# Patient Record
Sex: Female | Born: 1950 | Hispanic: No | Marital: Married | Smoking: Former smoker
Health system: Southern US, Community
[De-identification: ages and names within clinical notes are randomized; demographics above are authoritative.]

## PROBLEM LIST (undated history)

## (undated) DIAGNOSIS — K219 Gastro-esophageal reflux disease without esophagitis: Secondary | ICD-10-CM

## (undated) DIAGNOSIS — D649 Anemia, unspecified: Secondary | ICD-10-CM

## (undated) DIAGNOSIS — E039 Hypothyroidism, unspecified: Secondary | ICD-10-CM

## (undated) DIAGNOSIS — C801 Malignant (primary) neoplasm, unspecified: Secondary | ICD-10-CM

## (undated) DIAGNOSIS — F32A Depression, unspecified: Secondary | ICD-10-CM

## (undated) DIAGNOSIS — E7800 Pure hypercholesterolemia, unspecified: Secondary | ICD-10-CM

## (undated) DIAGNOSIS — C44301 Unspecified malignant neoplasm of skin of nose: Secondary | ICD-10-CM

## (undated) HISTORY — DX: Pure hypercholesterolemia, unspecified: E78.00

## (undated) HISTORY — PX: ABDOMINAL HYSTERECTOMY: SHX81

## (undated) HISTORY — PX: CHOLECYSTECTOMY: SHX55

## (undated) HISTORY — DX: Unspecified malignant neoplasm of skin of nose: C44.301

## (undated) HISTORY — DX: Anemia, unspecified: D64.9

## (undated) HISTORY — PX: COLONOSCOPY: SHX174

---

## 2001-07-31 ENCOUNTER — Other Ambulatory Visit: Admission: RE | Admit: 2001-07-31 | Discharge: 2001-07-31 | Payer: Self-pay | Admitting: Unknown Physician Specialty

## 2008-03-16 ENCOUNTER — Ambulatory Visit: Payer: Self-pay | Admitting: Cardiology

## 2015-05-19 DIAGNOSIS — H00016 Hordeolum externum left eye, unspecified eyelid: Secondary | ICD-10-CM | POA: Diagnosis not present

## 2015-05-19 DIAGNOSIS — H00013 Hordeolum externum right eye, unspecified eyelid: Secondary | ICD-10-CM | POA: Diagnosis not present

## 2015-07-19 DIAGNOSIS — I10 Essential (primary) hypertension: Secondary | ICD-10-CM | POA: Diagnosis not present

## 2015-07-19 DIAGNOSIS — R1013 Epigastric pain: Secondary | ICD-10-CM | POA: Diagnosis not present

## 2015-07-20 DIAGNOSIS — Z90710 Acquired absence of both cervix and uterus: Secondary | ICD-10-CM | POA: Diagnosis not present

## 2015-07-20 DIAGNOSIS — R1013 Epigastric pain: Secondary | ICD-10-CM | POA: Diagnosis not present

## 2015-07-20 DIAGNOSIS — Z9049 Acquired absence of other specified parts of digestive tract: Secondary | ICD-10-CM | POA: Diagnosis not present

## 2015-07-20 DIAGNOSIS — N838 Other noninflammatory disorders of ovary, fallopian tube and broad ligament: Secondary | ICD-10-CM | POA: Diagnosis not present

## 2015-11-15 DIAGNOSIS — Z23 Encounter for immunization: Secondary | ICD-10-CM | POA: Diagnosis not present

## 2015-12-03 DIAGNOSIS — B029 Zoster without complications: Secondary | ICD-10-CM | POA: Diagnosis not present

## 2015-12-23 DIAGNOSIS — Z299 Encounter for prophylactic measures, unspecified: Secondary | ICD-10-CM | POA: Diagnosis not present

## 2015-12-23 DIAGNOSIS — E7800 Pure hypercholesterolemia, unspecified: Secondary | ICD-10-CM | POA: Diagnosis not present

## 2015-12-23 DIAGNOSIS — Z6832 Body mass index (BMI) 32.0-32.9, adult: Secondary | ICD-10-CM | POA: Diagnosis not present

## 2015-12-23 DIAGNOSIS — Z0000 Encounter for general adult medical examination without abnormal findings: Secondary | ICD-10-CM | POA: Diagnosis not present

## 2015-12-23 DIAGNOSIS — Z79899 Other long term (current) drug therapy: Secondary | ICD-10-CM | POA: Diagnosis not present

## 2015-12-23 DIAGNOSIS — Z7189 Other specified counseling: Secondary | ICD-10-CM | POA: Diagnosis not present

## 2015-12-23 DIAGNOSIS — E039 Hypothyroidism, unspecified: Secondary | ICD-10-CM | POA: Diagnosis not present

## 2015-12-23 DIAGNOSIS — Z1211 Encounter for screening for malignant neoplasm of colon: Secondary | ICD-10-CM | POA: Diagnosis not present

## 2016-01-27 DIAGNOSIS — Z1231 Encounter for screening mammogram for malignant neoplasm of breast: Secondary | ICD-10-CM | POA: Diagnosis not present

## 2016-05-01 DIAGNOSIS — N3946 Mixed incontinence: Secondary | ICD-10-CM | POA: Diagnosis not present

## 2016-05-01 DIAGNOSIS — N393 Stress incontinence (female) (male): Secondary | ICD-10-CM | POA: Diagnosis not present

## 2016-09-22 DIAGNOSIS — M545 Low back pain: Secondary | ICD-10-CM | POA: Diagnosis not present

## 2016-09-22 DIAGNOSIS — Z79899 Other long term (current) drug therapy: Secondary | ICD-10-CM | POA: Diagnosis not present

## 2016-09-22 DIAGNOSIS — Z87891 Personal history of nicotine dependence: Secondary | ICD-10-CM | POA: Diagnosis not present

## 2016-09-22 DIAGNOSIS — R079 Chest pain, unspecified: Secondary | ICD-10-CM | POA: Diagnosis not present

## 2016-09-22 DIAGNOSIS — R05 Cough: Secondary | ICD-10-CM | POA: Diagnosis not present

## 2016-09-22 DIAGNOSIS — Z8249 Family history of ischemic heart disease and other diseases of the circulatory system: Secondary | ICD-10-CM | POA: Diagnosis not present

## 2016-09-22 DIAGNOSIS — F329 Major depressive disorder, single episode, unspecified: Secondary | ICD-10-CM | POA: Diagnosis not present

## 2016-12-04 DIAGNOSIS — Z299 Encounter for prophylactic measures, unspecified: Secondary | ICD-10-CM | POA: Diagnosis not present

## 2016-12-04 DIAGNOSIS — K219 Gastro-esophageal reflux disease without esophagitis: Secondary | ICD-10-CM | POA: Diagnosis not present

## 2016-12-04 DIAGNOSIS — E669 Obesity, unspecified: Secondary | ICD-10-CM | POA: Diagnosis not present

## 2016-12-04 DIAGNOSIS — Z87891 Personal history of nicotine dependence: Secondary | ICD-10-CM | POA: Diagnosis not present

## 2016-12-04 DIAGNOSIS — T304 Corrosion of unspecified body region, unspecified degree: Secondary | ICD-10-CM | POA: Diagnosis not present

## 2016-12-04 DIAGNOSIS — E039 Hypothyroidism, unspecified: Secondary | ICD-10-CM | POA: Diagnosis not present

## 2016-12-04 DIAGNOSIS — L259 Unspecified contact dermatitis, unspecified cause: Secondary | ICD-10-CM | POA: Diagnosis not present

## 2016-12-11 DIAGNOSIS — Z299 Encounter for prophylactic measures, unspecified: Secondary | ICD-10-CM | POA: Diagnosis not present

## 2016-12-11 DIAGNOSIS — T304 Corrosion of unspecified body region, unspecified degree: Secondary | ICD-10-CM | POA: Diagnosis not present

## 2016-12-11 DIAGNOSIS — Z6832 Body mass index (BMI) 32.0-32.9, adult: Secondary | ICD-10-CM | POA: Diagnosis not present

## 2016-12-11 DIAGNOSIS — E7800 Pure hypercholesterolemia, unspecified: Secondary | ICD-10-CM | POA: Diagnosis not present

## 2016-12-11 DIAGNOSIS — I10 Essential (primary) hypertension: Secondary | ICD-10-CM | POA: Diagnosis not present

## 2016-12-11 DIAGNOSIS — E039 Hypothyroidism, unspecified: Secondary | ICD-10-CM | POA: Diagnosis not present

## 2017-01-11 DIAGNOSIS — Z23 Encounter for immunization: Secondary | ICD-10-CM | POA: Diagnosis not present

## 2017-01-30 DIAGNOSIS — Z1231 Encounter for screening mammogram for malignant neoplasm of breast: Secondary | ICD-10-CM | POA: Diagnosis not present

## 2017-02-27 DIAGNOSIS — H1045 Other chronic allergic conjunctivitis: Secondary | ICD-10-CM | POA: Diagnosis not present

## 2017-02-27 DIAGNOSIS — H524 Presbyopia: Secondary | ICD-10-CM | POA: Diagnosis not present

## 2017-02-27 DIAGNOSIS — H2513 Age-related nuclear cataract, bilateral: Secondary | ICD-10-CM | POA: Diagnosis not present

## 2017-02-27 DIAGNOSIS — H52223 Regular astigmatism, bilateral: Secondary | ICD-10-CM | POA: Diagnosis not present

## 2017-02-27 DIAGNOSIS — H5203 Hypermetropia, bilateral: Secondary | ICD-10-CM | POA: Diagnosis not present

## 2017-04-03 DIAGNOSIS — Z299 Encounter for prophylactic measures, unspecified: Secondary | ICD-10-CM | POA: Diagnosis not present

## 2017-04-03 DIAGNOSIS — M25552 Pain in left hip: Secondary | ICD-10-CM | POA: Diagnosis not present

## 2017-04-03 DIAGNOSIS — M545 Low back pain: Secondary | ICD-10-CM | POA: Diagnosis not present

## 2017-04-03 DIAGNOSIS — Z6832 Body mass index (BMI) 32.0-32.9, adult: Secondary | ICD-10-CM | POA: Diagnosis not present

## 2017-04-03 DIAGNOSIS — M5440 Lumbago with sciatica, unspecified side: Secondary | ICD-10-CM | POA: Diagnosis not present

## 2017-04-03 DIAGNOSIS — Z713 Dietary counseling and surveillance: Secondary | ICD-10-CM | POA: Diagnosis not present

## 2017-04-20 DIAGNOSIS — M5386 Other specified dorsopathies, lumbar region: Secondary | ICD-10-CM | POA: Diagnosis not present

## 2017-04-20 DIAGNOSIS — E669 Obesity, unspecified: Secondary | ICD-10-CM | POA: Diagnosis not present

## 2017-04-20 DIAGNOSIS — M1612 Unilateral primary osteoarthritis, left hip: Secondary | ICD-10-CM | POA: Diagnosis not present

## 2017-04-20 DIAGNOSIS — M25552 Pain in left hip: Secondary | ICD-10-CM | POA: Diagnosis not present

## 2017-04-20 DIAGNOSIS — M1611 Unilateral primary osteoarthritis, right hip: Secondary | ICD-10-CM | POA: Diagnosis not present

## 2017-04-26 DIAGNOSIS — M1612 Unilateral primary osteoarthritis, left hip: Secondary | ICD-10-CM | POA: Diagnosis not present

## 2017-04-27 DIAGNOSIS — Z6831 Body mass index (BMI) 31.0-31.9, adult: Secondary | ICD-10-CM | POA: Diagnosis not present

## 2017-04-27 DIAGNOSIS — Z789 Other specified health status: Secondary | ICD-10-CM | POA: Diagnosis not present

## 2017-04-27 DIAGNOSIS — M549 Dorsalgia, unspecified: Secondary | ICD-10-CM | POA: Diagnosis not present

## 2017-04-27 DIAGNOSIS — Z299 Encounter for prophylactic measures, unspecified: Secondary | ICD-10-CM | POA: Diagnosis not present

## 2017-04-27 DIAGNOSIS — I10 Essential (primary) hypertension: Secondary | ICD-10-CM | POA: Diagnosis not present

## 2017-05-01 DIAGNOSIS — M25552 Pain in left hip: Secondary | ICD-10-CM | POA: Diagnosis not present

## 2017-05-01 DIAGNOSIS — M549 Dorsalgia, unspecified: Secondary | ICD-10-CM | POA: Diagnosis not present

## 2017-05-01 DIAGNOSIS — M1612 Unilateral primary osteoarthritis, left hip: Secondary | ICD-10-CM | POA: Diagnosis not present

## 2017-05-03 DIAGNOSIS — M5417 Radiculopathy, lumbosacral region: Secondary | ICD-10-CM | POA: Diagnosis not present

## 2017-05-08 DIAGNOSIS — M549 Dorsalgia, unspecified: Secondary | ICD-10-CM | POA: Diagnosis not present

## 2017-05-08 DIAGNOSIS — M47816 Spondylosis without myelopathy or radiculopathy, lumbar region: Secondary | ICD-10-CM | POA: Diagnosis not present

## 2017-05-08 DIAGNOSIS — M4316 Spondylolisthesis, lumbar region: Secondary | ICD-10-CM | POA: Diagnosis not present

## 2017-05-08 DIAGNOSIS — M4317 Spondylolisthesis, lumbosacral region: Secondary | ICD-10-CM | POA: Diagnosis not present

## 2017-05-08 DIAGNOSIS — M5186 Other intervertebral disc disorders, lumbar region: Secondary | ICD-10-CM | POA: Diagnosis not present

## 2017-05-23 DIAGNOSIS — M549 Dorsalgia, unspecified: Secondary | ICD-10-CM | POA: Diagnosis not present

## 2017-06-01 DIAGNOSIS — G894 Chronic pain syndrome: Secondary | ICD-10-CM | POA: Diagnosis not present

## 2017-06-01 DIAGNOSIS — G8929 Other chronic pain: Secondary | ICD-10-CM | POA: Diagnosis not present

## 2017-06-01 DIAGNOSIS — M5416 Radiculopathy, lumbar region: Secondary | ICD-10-CM | POA: Diagnosis not present

## 2017-06-01 DIAGNOSIS — M5442 Lumbago with sciatica, left side: Secondary | ICD-10-CM | POA: Diagnosis not present

## 2017-06-12 DIAGNOSIS — M5416 Radiculopathy, lumbar region: Secondary | ICD-10-CM | POA: Diagnosis not present

## 2017-07-13 DIAGNOSIS — M5416 Radiculopathy, lumbar region: Secondary | ICD-10-CM | POA: Diagnosis not present

## 2017-07-13 DIAGNOSIS — M5442 Lumbago with sciatica, left side: Secondary | ICD-10-CM | POA: Diagnosis not present

## 2017-07-13 DIAGNOSIS — G894 Chronic pain syndrome: Secondary | ICD-10-CM | POA: Diagnosis not present

## 2017-07-13 DIAGNOSIS — G8929 Other chronic pain: Secondary | ICD-10-CM | POA: Diagnosis not present

## 2017-07-19 DIAGNOSIS — G4700 Insomnia, unspecified: Secondary | ICD-10-CM | POA: Diagnosis not present

## 2017-07-19 DIAGNOSIS — Z79899 Other long term (current) drug therapy: Secondary | ICD-10-CM | POA: Diagnosis not present

## 2017-07-19 DIAGNOSIS — F418 Other specified anxiety disorders: Secondary | ICD-10-CM | POA: Diagnosis not present

## 2017-08-08 DIAGNOSIS — M5416 Radiculopathy, lumbar region: Secondary | ICD-10-CM | POA: Diagnosis not present

## 2018-01-13 DIAGNOSIS — Z23 Encounter for immunization: Secondary | ICD-10-CM | POA: Diagnosis not present

## 2018-01-16 DIAGNOSIS — M170 Bilateral primary osteoarthritis of knee: Secondary | ICD-10-CM | POA: Diagnosis not present

## 2018-01-16 DIAGNOSIS — M25562 Pain in left knee: Secondary | ICD-10-CM | POA: Diagnosis not present

## 2018-01-16 DIAGNOSIS — M25561 Pain in right knee: Secondary | ICD-10-CM | POA: Diagnosis not present

## 2018-02-28 DIAGNOSIS — Z1231 Encounter for screening mammogram for malignant neoplasm of breast: Secondary | ICD-10-CM | POA: Diagnosis not present

## 2018-03-19 DIAGNOSIS — Z6829 Body mass index (BMI) 29.0-29.9, adult: Secondary | ICD-10-CM | POA: Diagnosis not present

## 2018-03-19 DIAGNOSIS — I10 Essential (primary) hypertension: Secondary | ICD-10-CM | POA: Diagnosis not present

## 2018-03-19 DIAGNOSIS — Z299 Encounter for prophylactic measures, unspecified: Secondary | ICD-10-CM | POA: Diagnosis not present

## 2018-03-19 DIAGNOSIS — B372 Candidiasis of skin and nail: Secondary | ICD-10-CM | POA: Diagnosis not present

## 2018-05-22 DIAGNOSIS — Z6829 Body mass index (BMI) 29.0-29.9, adult: Secondary | ICD-10-CM | POA: Diagnosis not present

## 2018-05-22 DIAGNOSIS — Z7189 Other specified counseling: Secondary | ICD-10-CM | POA: Diagnosis not present

## 2018-05-22 DIAGNOSIS — Z1339 Encounter for screening examination for other mental health and behavioral disorders: Secondary | ICD-10-CM | POA: Diagnosis not present

## 2018-05-22 DIAGNOSIS — E7800 Pure hypercholesterolemia, unspecified: Secondary | ICD-10-CM | POA: Diagnosis not present

## 2018-05-22 DIAGNOSIS — R200 Anesthesia of skin: Secondary | ICD-10-CM | POA: Diagnosis not present

## 2018-05-22 DIAGNOSIS — Z299 Encounter for prophylactic measures, unspecified: Secondary | ICD-10-CM | POA: Diagnosis not present

## 2018-05-22 DIAGNOSIS — E039 Hypothyroidism, unspecified: Secondary | ICD-10-CM | POA: Diagnosis not present

## 2018-05-22 DIAGNOSIS — Z1211 Encounter for screening for malignant neoplasm of colon: Secondary | ICD-10-CM | POA: Diagnosis not present

## 2018-05-22 DIAGNOSIS — Z79899 Other long term (current) drug therapy: Secondary | ICD-10-CM | POA: Diagnosis not present

## 2018-05-22 DIAGNOSIS — Z1331 Encounter for screening for depression: Secondary | ICD-10-CM | POA: Diagnosis not present

## 2018-05-22 DIAGNOSIS — I10 Essential (primary) hypertension: Secondary | ICD-10-CM | POA: Diagnosis not present

## 2018-05-22 DIAGNOSIS — Z0000 Encounter for general adult medical examination without abnormal findings: Secondary | ICD-10-CM | POA: Diagnosis not present

## 2018-05-22 DIAGNOSIS — R5383 Other fatigue: Secondary | ICD-10-CM | POA: Diagnosis not present

## 2018-06-05 DIAGNOSIS — Z6828 Body mass index (BMI) 28.0-28.9, adult: Secondary | ICD-10-CM | POA: Diagnosis not present

## 2018-06-05 DIAGNOSIS — R21 Rash and other nonspecific skin eruption: Secondary | ICD-10-CM | POA: Diagnosis not present

## 2018-06-05 DIAGNOSIS — Z299 Encounter for prophylactic measures, unspecified: Secondary | ICD-10-CM | POA: Diagnosis not present

## 2018-06-05 DIAGNOSIS — M792 Neuralgia and neuritis, unspecified: Secondary | ICD-10-CM | POA: Diagnosis not present

## 2018-06-05 DIAGNOSIS — Z87891 Personal history of nicotine dependence: Secondary | ICD-10-CM | POA: Diagnosis not present

## 2018-06-05 DIAGNOSIS — I10 Essential (primary) hypertension: Secondary | ICD-10-CM | POA: Diagnosis not present

## 2018-09-13 DIAGNOSIS — M170 Bilateral primary osteoarthritis of knee: Secondary | ICD-10-CM | POA: Diagnosis not present

## 2018-09-13 DIAGNOSIS — M1712 Unilateral primary osteoarthritis, left knee: Secondary | ICD-10-CM | POA: Diagnosis not present

## 2018-09-13 DIAGNOSIS — M1711 Unilateral primary osteoarthritis, right knee: Secondary | ICD-10-CM | POA: Diagnosis not present

## 2018-11-25 DIAGNOSIS — Z79899 Other long term (current) drug therapy: Secondary | ICD-10-CM | POA: Diagnosis not present

## 2018-11-25 DIAGNOSIS — E038 Other specified hypothyroidism: Secondary | ICD-10-CM | POA: Diagnosis not present

## 2018-12-16 DIAGNOSIS — J329 Chronic sinusitis, unspecified: Secondary | ICD-10-CM | POA: Diagnosis not present

## 2018-12-16 DIAGNOSIS — Z299 Encounter for prophylactic measures, unspecified: Secondary | ICD-10-CM | POA: Diagnosis not present

## 2018-12-16 DIAGNOSIS — Z87891 Personal history of nicotine dependence: Secondary | ICD-10-CM | POA: Diagnosis not present

## 2018-12-26 DIAGNOSIS — M25561 Pain in right knee: Secondary | ICD-10-CM | POA: Diagnosis not present

## 2018-12-26 DIAGNOSIS — M170 Bilateral primary osteoarthritis of knee: Secondary | ICD-10-CM | POA: Diagnosis not present

## 2019-01-15 DIAGNOSIS — Z23 Encounter for immunization: Secondary | ICD-10-CM | POA: Diagnosis not present

## 2019-05-28 DIAGNOSIS — Z79899 Other long term (current) drug therapy: Secondary | ICD-10-CM | POA: Diagnosis not present

## 2019-05-28 DIAGNOSIS — R739 Hyperglycemia, unspecified: Secondary | ICD-10-CM | POA: Diagnosis not present

## 2019-05-28 DIAGNOSIS — Z1339 Encounter for screening examination for other mental health and behavioral disorders: Secondary | ICD-10-CM | POA: Diagnosis not present

## 2019-05-28 DIAGNOSIS — I10 Essential (primary) hypertension: Secondary | ICD-10-CM | POA: Diagnosis not present

## 2019-05-28 DIAGNOSIS — Z1211 Encounter for screening for malignant neoplasm of colon: Secondary | ICD-10-CM | POA: Diagnosis not present

## 2019-05-28 DIAGNOSIS — R5383 Other fatigue: Secondary | ICD-10-CM | POA: Diagnosis not present

## 2019-05-28 DIAGNOSIS — Z1331 Encounter for screening for depression: Secondary | ICD-10-CM | POA: Diagnosis not present

## 2019-05-28 DIAGNOSIS — E039 Hypothyroidism, unspecified: Secondary | ICD-10-CM | POA: Diagnosis not present

## 2019-05-28 DIAGNOSIS — Z0000 Encounter for general adult medical examination without abnormal findings: Secondary | ICD-10-CM | POA: Diagnosis not present

## 2019-05-28 DIAGNOSIS — Z7189 Other specified counseling: Secondary | ICD-10-CM | POA: Diagnosis not present

## 2019-05-28 DIAGNOSIS — E7800 Pure hypercholesterolemia, unspecified: Secondary | ICD-10-CM | POA: Diagnosis not present

## 2019-05-28 DIAGNOSIS — Z299 Encounter for prophylactic measures, unspecified: Secondary | ICD-10-CM | POA: Diagnosis not present

## 2019-05-28 DIAGNOSIS — Z6832 Body mass index (BMI) 32.0-32.9, adult: Secondary | ICD-10-CM | POA: Diagnosis not present

## 2019-06-05 DIAGNOSIS — Z23 Encounter for immunization: Secondary | ICD-10-CM | POA: Diagnosis not present

## 2019-06-06 DIAGNOSIS — T8062XA Other serum reaction due to vaccination, initial encounter: Secondary | ICD-10-CM | POA: Diagnosis not present

## 2019-06-06 DIAGNOSIS — R197 Diarrhea, unspecified: Secondary | ICD-10-CM | POA: Diagnosis not present

## 2019-06-06 DIAGNOSIS — T50B95A Adverse effect of other viral vaccines, initial encounter: Secondary | ICD-10-CM | POA: Diagnosis not present

## 2019-06-06 DIAGNOSIS — K921 Melena: Secondary | ICD-10-CM | POA: Diagnosis not present

## 2019-07-07 DIAGNOSIS — L309 Dermatitis, unspecified: Secondary | ICD-10-CM | POA: Diagnosis not present

## 2019-07-23 DIAGNOSIS — L309 Dermatitis, unspecified: Secondary | ICD-10-CM | POA: Diagnosis not present

## 2019-07-23 DIAGNOSIS — L570 Actinic keratosis: Secondary | ICD-10-CM | POA: Diagnosis not present

## 2019-08-15 DIAGNOSIS — R42 Dizziness and giddiness: Secondary | ICD-10-CM | POA: Diagnosis not present

## 2019-08-15 DIAGNOSIS — Z87891 Personal history of nicotine dependence: Secondary | ICD-10-CM | POA: Diagnosis not present

## 2019-08-15 DIAGNOSIS — M79662 Pain in left lower leg: Secondary | ICD-10-CM | POA: Diagnosis not present

## 2019-08-15 DIAGNOSIS — Z299 Encounter for prophylactic measures, unspecified: Secondary | ICD-10-CM | POA: Diagnosis not present

## 2019-08-15 DIAGNOSIS — I10 Essential (primary) hypertension: Secondary | ICD-10-CM | POA: Diagnosis not present

## 2019-09-10 DIAGNOSIS — M170 Bilateral primary osteoarthritis of knee: Secondary | ICD-10-CM | POA: Diagnosis not present

## 2019-09-30 DIAGNOSIS — Z299 Encounter for prophylactic measures, unspecified: Secondary | ICD-10-CM | POA: Diagnosis not present

## 2019-09-30 DIAGNOSIS — R109 Unspecified abdominal pain: Secondary | ICD-10-CM | POA: Diagnosis not present

## 2019-09-30 DIAGNOSIS — I10 Essential (primary) hypertension: Secondary | ICD-10-CM | POA: Diagnosis not present

## 2019-10-03 DIAGNOSIS — R109 Unspecified abdominal pain: Secondary | ICD-10-CM | POA: Diagnosis not present

## 2019-10-03 DIAGNOSIS — R1013 Epigastric pain: Secondary | ICD-10-CM | POA: Diagnosis not present

## 2019-10-07 DIAGNOSIS — Z299 Encounter for prophylactic measures, unspecified: Secondary | ICD-10-CM | POA: Diagnosis not present

## 2019-10-07 DIAGNOSIS — R109 Unspecified abdominal pain: Secondary | ICD-10-CM | POA: Diagnosis not present

## 2019-10-07 DIAGNOSIS — K668 Other specified disorders of peritoneum: Secondary | ICD-10-CM | POA: Diagnosis not present

## 2019-10-07 DIAGNOSIS — I10 Essential (primary) hypertension: Secondary | ICD-10-CM | POA: Diagnosis not present

## 2019-10-07 DIAGNOSIS — I700 Atherosclerosis of aorta: Secondary | ICD-10-CM | POA: Diagnosis not present

## 2019-10-07 DIAGNOSIS — K8020 Calculus of gallbladder without cholecystitis without obstruction: Secondary | ICD-10-CM | POA: Diagnosis not present

## 2019-10-07 DIAGNOSIS — Z6832 Body mass index (BMI) 32.0-32.9, adult: Secondary | ICD-10-CM | POA: Diagnosis not present

## 2019-11-06 DIAGNOSIS — Z23 Encounter for immunization: Secondary | ICD-10-CM | POA: Diagnosis not present

## 2020-01-22 DIAGNOSIS — E7849 Other hyperlipidemia: Secondary | ICD-10-CM | POA: Diagnosis not present

## 2020-01-22 DIAGNOSIS — E039 Hypothyroidism, unspecified: Secondary | ICD-10-CM | POA: Diagnosis not present

## 2020-01-22 DIAGNOSIS — K219 Gastro-esophageal reflux disease without esophagitis: Secondary | ICD-10-CM | POA: Diagnosis not present

## 2020-02-17 DIAGNOSIS — M170 Bilateral primary osteoarthritis of knee: Secondary | ICD-10-CM | POA: Diagnosis not present

## 2020-02-17 DIAGNOSIS — M79605 Pain in left leg: Secondary | ICD-10-CM | POA: Diagnosis not present

## 2020-02-29 DIAGNOSIS — J069 Acute upper respiratory infection, unspecified: Secondary | ICD-10-CM | POA: Diagnosis not present

## 2020-02-29 DIAGNOSIS — J029 Acute pharyngitis, unspecified: Secondary | ICD-10-CM | POA: Diagnosis not present

## 2020-03-08 DIAGNOSIS — Z23 Encounter for immunization: Secondary | ICD-10-CM | POA: Diagnosis not present

## 2020-03-23 DIAGNOSIS — E039 Hypothyroidism, unspecified: Secondary | ICD-10-CM | POA: Diagnosis not present

## 2020-03-23 DIAGNOSIS — R109 Unspecified abdominal pain: Secondary | ICD-10-CM | POA: Diagnosis not present

## 2020-04-20 DIAGNOSIS — J329 Chronic sinusitis, unspecified: Secondary | ICD-10-CM | POA: Diagnosis not present

## 2020-04-20 DIAGNOSIS — B373 Candidiasis of vulva and vagina: Secondary | ICD-10-CM | POA: Diagnosis not present

## 2020-04-20 DIAGNOSIS — I10 Essential (primary) hypertension: Secondary | ICD-10-CM | POA: Diagnosis not present

## 2020-04-20 DIAGNOSIS — E039 Hypothyroidism, unspecified: Secondary | ICD-10-CM | POA: Diagnosis not present

## 2020-04-20 DIAGNOSIS — Z299 Encounter for prophylactic measures, unspecified: Secondary | ICD-10-CM | POA: Diagnosis not present

## 2020-04-23 DIAGNOSIS — E039 Hypothyroidism, unspecified: Secondary | ICD-10-CM | POA: Diagnosis not present

## 2020-04-23 DIAGNOSIS — R109 Unspecified abdominal pain: Secondary | ICD-10-CM | POA: Diagnosis not present

## 2020-04-27 DIAGNOSIS — Z1231 Encounter for screening mammogram for malignant neoplasm of breast: Secondary | ICD-10-CM | POA: Diagnosis not present

## 2020-05-21 DIAGNOSIS — I700 Atherosclerosis of aorta: Secondary | ICD-10-CM | POA: Diagnosis not present

## 2020-05-21 DIAGNOSIS — I10 Essential (primary) hypertension: Secondary | ICD-10-CM | POA: Diagnosis not present

## 2020-05-21 DIAGNOSIS — Z299 Encounter for prophylactic measures, unspecified: Secondary | ICD-10-CM | POA: Diagnosis not present

## 2020-05-21 DIAGNOSIS — R197 Diarrhea, unspecified: Secondary | ICD-10-CM | POA: Diagnosis not present

## 2020-05-21 DIAGNOSIS — R110 Nausea: Secondary | ICD-10-CM | POA: Diagnosis not present

## 2020-05-24 DIAGNOSIS — R109 Unspecified abdominal pain: Secondary | ICD-10-CM | POA: Diagnosis not present

## 2020-05-24 DIAGNOSIS — E039 Hypothyroidism, unspecified: Secondary | ICD-10-CM | POA: Diagnosis not present

## 2020-06-01 DIAGNOSIS — Z1339 Encounter for screening examination for other mental health and behavioral disorders: Secondary | ICD-10-CM | POA: Diagnosis not present

## 2020-06-01 DIAGNOSIS — Z299 Encounter for prophylactic measures, unspecified: Secondary | ICD-10-CM | POA: Diagnosis not present

## 2020-06-01 DIAGNOSIS — Z79899 Other long term (current) drug therapy: Secondary | ICD-10-CM | POA: Diagnosis not present

## 2020-06-01 DIAGNOSIS — D509 Iron deficiency anemia, unspecified: Secondary | ICD-10-CM | POA: Diagnosis not present

## 2020-06-01 DIAGNOSIS — E7800 Pure hypercholesterolemia, unspecified: Secondary | ICD-10-CM | POA: Diagnosis not present

## 2020-06-01 DIAGNOSIS — E039 Hypothyroidism, unspecified: Secondary | ICD-10-CM | POA: Diagnosis not present

## 2020-06-01 DIAGNOSIS — I10 Essential (primary) hypertension: Secondary | ICD-10-CM | POA: Diagnosis not present

## 2020-06-01 DIAGNOSIS — Z7189 Other specified counseling: Secondary | ICD-10-CM | POA: Diagnosis not present

## 2020-06-01 DIAGNOSIS — R5383 Other fatigue: Secondary | ICD-10-CM | POA: Diagnosis not present

## 2020-06-01 DIAGNOSIS — Z1331 Encounter for screening for depression: Secondary | ICD-10-CM | POA: Diagnosis not present

## 2020-06-01 DIAGNOSIS — Z6832 Body mass index (BMI) 32.0-32.9, adult: Secondary | ICD-10-CM | POA: Diagnosis not present

## 2020-06-01 DIAGNOSIS — Z0000 Encounter for general adult medical examination without abnormal findings: Secondary | ICD-10-CM | POA: Diagnosis not present

## 2020-06-02 ENCOUNTER — Encounter (INDEPENDENT_AMBULATORY_CARE_PROVIDER_SITE_OTHER): Payer: Self-pay | Admitting: *Deleted

## 2020-06-03 ENCOUNTER — Other Ambulatory Visit (INDEPENDENT_AMBULATORY_CARE_PROVIDER_SITE_OTHER): Payer: Self-pay

## 2020-06-03 ENCOUNTER — Telehealth (INDEPENDENT_AMBULATORY_CARE_PROVIDER_SITE_OTHER): Payer: Self-pay

## 2020-06-03 ENCOUNTER — Ambulatory Visit (INDEPENDENT_AMBULATORY_CARE_PROVIDER_SITE_OTHER): Payer: Medicare Other | Admitting: Gastroenterology

## 2020-06-03 ENCOUNTER — Other Ambulatory Visit: Payer: Self-pay

## 2020-06-03 ENCOUNTER — Encounter (INDEPENDENT_AMBULATORY_CARE_PROVIDER_SITE_OTHER): Payer: Self-pay | Admitting: Gastroenterology

## 2020-06-03 ENCOUNTER — Encounter (INDEPENDENT_AMBULATORY_CARE_PROVIDER_SITE_OTHER): Payer: Self-pay

## 2020-06-03 DIAGNOSIS — I10 Essential (primary) hypertension: Secondary | ICD-10-CM | POA: Diagnosis not present

## 2020-06-03 DIAGNOSIS — Z1211 Encounter for screening for malignant neoplasm of colon: Secondary | ICD-10-CM

## 2020-06-03 DIAGNOSIS — K219 Gastro-esophageal reflux disease without esophagitis: Secondary | ICD-10-CM | POA: Diagnosis not present

## 2020-06-03 DIAGNOSIS — D509 Iron deficiency anemia, unspecified: Secondary | ICD-10-CM

## 2020-06-03 DIAGNOSIS — I700 Atherosclerosis of aorta: Secondary | ICD-10-CM | POA: Diagnosis not present

## 2020-06-03 DIAGNOSIS — Z299 Encounter for prophylactic measures, unspecified: Secondary | ICD-10-CM | POA: Diagnosis not present

## 2020-06-03 DIAGNOSIS — E039 Hypothyroidism, unspecified: Secondary | ICD-10-CM | POA: Diagnosis not present

## 2020-06-03 MED ORDER — NA SULFATE-K SULFATE-MG SULF 17.5-3.13-1.6 GM/177ML PO SOLN: 354.0000 mL | Freq: Once | ORAL | 0 refills | Status: AC

## 2020-06-03 NOTE — Progress Notes (Signed)
[name], M.D. Gastroenterology & Hepatology [facility] [address] [place], [place] [address] Primary Care Physician: Berenice Primas [place] [address]  Referring MD: PCP  Chief Complaint: Iron deficiency anemia  History of Present Illness: Bianca Vasquez is a 70 y.o. female with PMH depression, hypothyroidism, GERD, HLD, who presents for evaluation of iron deficiency anemia.  The patient was referred to our clinic after being found to have iron deficiency anemia.  She denied any previous complaints when she had this evaluation. I reviewed the lab reports from PCP.  Labs from 06/01/2020 were available which showed normocytic anemia with a hemoglobin of 10.8, MCV 80, white blood cell count 7.9, platelets 358, low iron stores based on ferritin 13, iron 39, the CMP showed normal liver enzymes with AST 29, ALT 19, alkaline phosphatase 108, total bilirubin 0.2, normal electrolytes and renal function with BUN 10 and creatinine 0.85.  The patient reports that she was not started on iron as her PCP considered to start his medication until she had an evaluation with an endoscopy.  Of note, the patient reports that last Tuesday she had multiple episodes of diarrhea, with nausea and vomiting, with abdominal cramping. This episode lasted for a couple of days. She reports that she has had multiple similar episodes since her 25s, they currently happen every couple of months or so but they are milder in severity. She used to have more cramping in the past but these are less frequent. This time she saw scant amount of fresh blood in her stool. States her most recent episode has completely resolved.  The patient denies having any recent lightheadedness, dizziness, nausea, vomiting, fever, chills, , melena, hematemesis, abdominal distention, abdominal pain, diarrhea, jaundice, pruritus or weight loss.  Patient takes Protonix  40 mg every night which controls her heartburn.  Takes Aleve for arm pain, takes it on average 4 days a month. Denies intake of  anticoagulants, high dose aspirin, or any other antiplatelet.  Last FIE:[facility] Last Colonoscopy:2005 - at [facility], normal per patient but no report available  FHx: neg for any gastrointestinal/liver disease, sister skin cancer, aunts x2 lung cancer Social: neg smoking, alcohol or illicit drug use Surgical: cholecystectomy and hysterectomy  Past Medical History: Past Medical History:  Diagnosis Date  . Anemia     Past Surgical History: Past Surgical History:  Procedure Laterality Date  . ABDOMINAL HYSTERECTOMY     total  . CHOLECYSTECTOMY    . COLONOSCOPY      Family History: Family History  Problem Relation Age of Onset  . COPD Mother   . COPD Father   . Healthy Brother   . Heart disease Sister   . Kidney disease Sister   . Fibromyalgia Sister   . Migraines Sister   . Emphysema Sister     Social History: Social History   Tobacco Use  Smoking Status Former Smoker  Smokeless Tobacco Never Used  Tobacco Comment   Quit 35 years ago   Social History   Substance and Sexual Activity  Alcohol Use Never   Social History   Substance and Sexual Activity  Drug Use Never    Allergies: Allergies  Allergen Reactions  . Augmentin [Amoxicillin-Pot Clavulanate] Nausea And Vomiting    Medications: Current Outpatient Medications  Medication Sig Dispense Refill  . amitriptyline (ELAVIL) 25 MG tablet Take 25 mg by mouth daily.    Marland Kitchen buPROPion (ZYBAN) 150 MG 12 hr tablet Take  150 mg by mouth daily.    Marland Kitchen estradiol (ESTRACE) 1 MG tablet Take 1 mg by mouth daily.    Marland Kitchen levothyroxine (SYNTHROID) 125 MCG tablet Take 125 mcg by mouth daily before breakfast.    . montelukast (SINGULAIR) 10 MG tablet Take 10 mg by mouth at bedtime.    . pantoprazole (PROTONIX) 40 MG tablet Take 40 mg by mouth daily.    . sertraline (ZOLOFT) 100 MG tablet Take 100 mg  by mouth daily.    . simvastatin (ZOCOR) 40 MG tablet Take 20 mg by mouth daily.     No current facility-administered medications for this visit.    Review of Systems: GENERAL: negative for malaise, night sweats HEENT: No changes in hearing or vision, no nose bleeds or other nasal problems. NECK: Negative for lumps, goiter, pain and significant neck swelling RESPIRATORY: Negative for cough, wheezing CARDIOVASCULAR: Negative for chest pain, leg swelling, palpitations, orthopnea GI: SEE HPI MUSCULOSKELETAL: Negative for joint pain or swelling, back pain, and muscle pain. SKIN: Negative for lesions, rash PSYCH: Negative for sleep disturbance, mood disorder and recent psychosocial stressors. HEMATOLOGY Negative for prolonged bleeding, bruising easily, and swollen nodes. ENDOCRINE: Negative for cold or heat intolerance, polyuria, polydipsia and goiter. NEURO: negative for tremor, gait imbalance, syncope and seizures. The remainder of the review of systems is noncontributory.   Physical Exam: BP 140/80 (BP Location: Left Arm, Patient Position: Sitting, Cuff Size: Large)   Pulse 97   Temp 98.6 F (37 C) (Oral)   Ht 5\' 5"  (1.651 m)   Wt 193 lb (87.5 kg)   BMI 32.12 kg/m  GENERAL: The patient is AO x3, in no acute distress. HEENT: Head is normocephalic and atraumatic. EOMI are intact. Mouth is well hydrated and without lesions. NECK: Supple. No masses LUNGS: Clear to auscultation. No presence of rhonchi/wheezing/rales. Adequate chest expansion HEART: RRR, normal s1 and s2. ABDOMEN: Soft, nontender, no guarding, no peritoneal signs, and nondistended. BS +. No masses. EXTREMITIES: Without any cyanosis, clubbing, rash, lesions or edema. NEUROLOGIC: AOx3, no focal motor deficit. SKIN: no jaundice, no rashes   Imaging/Labs: as above  I personally reviewed and interpreted the available labs, imaging and endoscopic files.  Impression and Plan: Bianca Vasquez is a 70 y.o. female  with PMH depression, hypothyroidism, GERD, HLD, who presents for evaluation of iron deficiency anemia.  Patient is presenting with normocytic anemia with evidence of a low iron stores consistent with iron deficiency anemia.  She has not had any significant symptoms due to the anemia yet.  Has not required any blood transfusions.  Had an isolated episode of lower gastrointestinal bleeding that resolved after she presented an acute episode of possible gastroenteritis.  However, she has not presented any chronic overt gastrointestinal bleeding.  Given her presentation, will need to explore her iron deficiency anemia with an EGD with small bowel biopsies and a colonoscopy.  We will also check for celiac serologies given her chronic symptoms of intermittent diarrhea.  There is no contraindication from my standpoint to start iron supplementation at this point, which she will need to discuss with her primary care.  -Schedule EGD with SB biopsies and colonoscopy -TTG IgA  All questions were answered.      [name], MD Gastroenterology and Hepatology [facility]

## 2020-06-03 NOTE — Telephone Encounter (Signed)
LeighAnn Chisom Muntean, CMA  

## 2020-06-03 NOTE — Patient Instructions (Signed)
Schedule EGD with SB biopsies and colonoscopy Perform blood workup

## 2020-06-03 NOTE — H&P (View-Only) (Signed)
Bianca Vasquez, M.D. Gastroenterology & Hepatology [facility] [address] [place], [place] [address] Primary Care Physician: Bianca Vasquez Bianca Vasquez [place] [address]  Referring MD: PCP  Chief Complaint: Iron deficiency anemia  History of Present Illness: Bianca Vasquez is a 70 y.o. female with PMH depression, hypothyroidism, GERD, HLD, who presents for evaluation of iron deficiency anemia.  The patient was referred to our clinic after being found to have iron deficiency anemia.  She denied any previous complaints when she had this evaluation. I reviewed the lab reports from PCP.  Labs from 06/01/2020 were available which showed normocytic anemia with a hemoglobin of 10.8, MCV 80, white blood cell count 7.9, platelets 358, low iron stores based on ferritin 13, iron 39, the CMP showed normal liver enzymes with AST 29, ALT 19, alkaline phosphatase 108, total bilirubin 0.2, normal electrolytes and renal function with BUN 10 and creatinine 0.85.  The patient reports that she was not started on iron as her PCP considered to start his medication until she had an evaluation with an endoscopy.  Of note, the patient reports that last Tuesday she had multiple episodes of diarrhea, with nausea and vomiting, with abdominal cramping. This episode lasted for a couple of days. She reports that she has had multiple similar episodes since her 95s, they currently happen every couple of months or so but they are milder in severity. She used to have more cramping in the past but these are less frequent. This time she saw scant amount of fresh blood in her stool. States her most recent episode has completely resolved.  The patient denies having any recent lightheadedness, dizziness, nausea, vomiting, fever, chills, , melena, hematemesis, abdominal distention, abdominal pain, diarrhea, jaundice, pruritus or weight loss.  Patient takes Protonix  40 mg every night which controls her heartburn.  Takes Aleve for arm pain, takes it on average 4 days a month. Denies intake of  anticoagulants, high dose aspirin, or any other antiplatelet.  Last N[facility] Last Colonoscopy:2005 - at [facility], normal per patient but no report available  FHx: neg for any gastrointestinal/liver disease, sister skin cancer, aunts x2 lung cancer Social: neg smoking, alcohol or illicit drug use Surgical: cholecystectomy and hysterectomy  Past Medical History: Past Medical History:  Diagnosis Date  . Anemia     Past Surgical History: Past Surgical History:  Procedure Laterality Date  . ABDOMINAL HYSTERECTOMY     total  . CHOLECYSTECTOMY    . COLONOSCOPY      Family History: Family History  Problem Relation Age of Onset  . COPD Mother   . COPD Father   . Healthy Brother   . Heart disease Sister   . Kidney disease Sister   . Fibromyalgia Sister   . Migraines Sister   . Emphysema Sister     Social History: Social History   Tobacco Use  Smoking Status Former Smoker  Smokeless Tobacco Never Used  Tobacco Comment   Quit 35 years ago   Social History   Substance and Sexual Activity  Alcohol Use Never   Social History   Substance and Sexual Activity  Drug Use Never    Allergies: Allergies  Allergen Reactions  . Augmentin [Amoxicillin-Pot Clavulanate] Nausea And Vomiting    Medications: Current Outpatient Medications  Medication Sig Dispense Refill  . amitriptyline (ELAVIL) 25 MG tablet Take 25 mg by mouth daily.    Marland Kitchen buPROPion (ZYBAN) 150 MG 12 hr tablet Take  150 mg by mouth daily.    Marland Kitchen estradiol (ESTRACE) 1 MG tablet Take 1 mg by mouth daily.    Marland Kitchen levothyroxine (SYNTHROID) 125 MCG tablet Take 125 mcg by mouth daily before breakfast.    . montelukast (SINGULAIR) 10 MG tablet Take 10 mg by mouth at bedtime.    . pantoprazole (PROTONIX) 40 MG tablet Take 40 mg by mouth daily.    . sertraline (ZOLOFT) 100 MG tablet Take 100 mg  by mouth daily.    . simvastatin (ZOCOR) 40 MG tablet Take 20 mg by mouth daily.     No current facility-administered medications for this visit.    Review of Systems: GENERAL: negative for malaise, night sweats HEENT: No changes in hearing or vision, no nose bleeds or other nasal problems. NECK: Negative for lumps, goiter, pain and significant neck swelling RESPIRATORY: Negative for cough, wheezing CARDIOVASCULAR: Negative for chest pain, leg swelling, palpitations, orthopnea GI: SEE HPI MUSCULOSKELETAL: Negative for joint pain or swelling, back pain, and muscle pain. SKIN: Negative for lesions, rash PSYCH: Negative for sleep disturbance, mood disorder and recent psychosocial stressors. HEMATOLOGY Negative for prolonged bleeding, bruising easily, and swollen nodes. ENDOCRINE: Negative for cold or heat intolerance, polyuria, polydipsia and goiter. NEURO: negative for tremor, gait imbalance, syncope and seizures. The remainder of the review of systems is noncontributory.   Physical Exam: BP 140/80 (BP Location: Left Arm, Patient Position: Sitting, Cuff Size: Large)   Pulse 97   Temp 98.6 F (37 C) (Oral)   Ht 5\' 5"  (1.651 m)   Wt 193 lb (87.5 kg)   BMI 32.12 kg/m  GENERAL: The patient is AO x3, in no acute distress. HEENT: Head is normocephalic and atraumatic. EOMI are intact. Mouth is well hydrated and without lesions. NECK: Supple. No masses LUNGS: Clear to auscultation. No presence of rhonchi/wheezing/rales. Adequate chest expansion HEART: RRR, normal s1 and s2. ABDOMEN: Soft, nontender, no guarding, no peritoneal signs, and nondistended. BS +. No masses. EXTREMITIES: Without any cyanosis, clubbing, rash, lesions or edema. NEUROLOGIC: AOx3, no focal motor deficit. SKIN: no jaundice, no rashes   Imaging/Labs: as above  I personally reviewed and interpreted the available labs, imaging and endoscopic files.  Impression and Plan: Bianca Vasquez is a 70 y.o. female  with PMH depression, hypothyroidism, GERD, HLD, who presents for evaluation of iron deficiency anemia.  Patient is presenting with normocytic anemia with evidence of a low iron stores consistent with iron deficiency anemia.  She has not had any significant symptoms due to the anemia yet.  Has not required any blood transfusions.  Had an isolated episode of lower gastrointestinal bleeding that resolved after she presented an acute episode of possible gastroenteritis.  However, she has not presented any chronic overt gastrointestinal bleeding.  Given her presentation, will need to explore her iron deficiency anemia with an EGD with small bowel biopsies and a colonoscopy.  We will also check for celiac serologies given her chronic symptoms of intermittent diarrhea.  There is no contraindication from my standpoint to start iron supplementation at this point, which she will need to discuss with her primary care.  -Schedule EGD with SB biopsies and colonoscopy -TTG IgA  All questions were answered.      Bianca Peppers, MD Gastroenterology and Hepatology [facility]

## 2020-06-04 LAB — IGA: Immunoglobulin A: 262 mg/dL (ref 70–320)

## 2020-06-04 LAB — TISSUE TRANSGLUTAMINASE, IGA: (tTG) Ab, IgA: <1 U/mL

## 2020-06-07 ENCOUNTER — Encounter (INDEPENDENT_AMBULATORY_CARE_PROVIDER_SITE_OTHER): Payer: Self-pay

## 2020-06-08 DIAGNOSIS — Z23 Encounter for immunization: Secondary | ICD-10-CM | POA: Diagnosis not present

## 2020-06-10 DIAGNOSIS — N951 Menopausal and female climacteric states: Secondary | ICD-10-CM | POA: Diagnosis not present

## 2020-06-10 DIAGNOSIS — Z79899 Other long term (current) drug therapy: Secondary | ICD-10-CM | POA: Diagnosis not present

## 2020-06-21 ENCOUNTER — Other Ambulatory Visit (HOSPITAL_COMMUNITY)
Admission: RE | Admit: 2020-06-21 | Discharge: 2020-06-21 | Disposition: A | Discharge status: Home / Self Care | Payer: Medicare Other | Source: Ambulatory Visit | Attending: Gastroenterology | Admitting: Gastroenterology

## 2020-06-21 ENCOUNTER — Other Ambulatory Visit: Payer: Self-pay

## 2020-06-21 DIAGNOSIS — R109 Unspecified abdominal pain: Secondary | ICD-10-CM | POA: Diagnosis not present

## 2020-06-21 DIAGNOSIS — Z20822 Contact with and (suspected) exposure to covid-19: Secondary | ICD-10-CM | POA: Insufficient documentation

## 2020-06-21 DIAGNOSIS — Z01812 Encounter for preprocedural laboratory examination: Secondary | ICD-10-CM | POA: Diagnosis not present

## 2020-06-21 DIAGNOSIS — E039 Hypothyroidism, unspecified: Secondary | ICD-10-CM | POA: Diagnosis not present

## 2020-06-21 LAB — SARS CORONAVIRUS 2 (TAT 6-24 HRS): SARS Coronavirus 2: NEGATIVE

## 2020-06-22 ENCOUNTER — Ambulatory Visit (HOSPITAL_COMMUNITY): Payer: Medicare Other | Admitting: Anesthesiology

## 2020-06-22 ENCOUNTER — Encounter (INDEPENDENT_AMBULATORY_CARE_PROVIDER_SITE_OTHER): Payer: Self-pay

## 2020-06-22 ENCOUNTER — Encounter (HOSPITAL_COMMUNITY): Payer: Self-pay | Admitting: Gastroenterology

## 2020-06-22 ENCOUNTER — Encounter (HOSPITAL_COMMUNITY)
Admission: RE | Disposition: A | Discharge status: Home / Self Care | Payer: Self-pay | Source: Home / Self Care | Attending: Gastroenterology

## 2020-06-22 ENCOUNTER — Other Ambulatory Visit (INDEPENDENT_AMBULATORY_CARE_PROVIDER_SITE_OTHER): Payer: Self-pay

## 2020-06-22 ENCOUNTER — Other Ambulatory Visit (INDEPENDENT_AMBULATORY_CARE_PROVIDER_SITE_OTHER): Payer: Self-pay | Admitting: Gastroenterology

## 2020-06-22 ENCOUNTER — Ambulatory Visit (HOSPITAL_COMMUNITY)
Admission: RE | Admit: 2020-06-22 | Discharge: 2020-06-22 | Disposition: A | Discharge status: Home / Self Care | Payer: Medicare Other | Attending: Gastroenterology | Admitting: Gastroenterology

## 2020-06-22 ENCOUNTER — Other Ambulatory Visit: Payer: Self-pay

## 2020-06-22 DIAGNOSIS — D509 Iron deficiency anemia, unspecified: Secondary | ICD-10-CM | POA: Diagnosis not present

## 2020-06-22 DIAGNOSIS — E785 Hyperlipidemia, unspecified: Secondary | ICD-10-CM | POA: Diagnosis not present

## 2020-06-22 DIAGNOSIS — Z79890 Hormone replacement therapy (postmenopausal): Secondary | ICD-10-CM | POA: Diagnosis not present

## 2020-06-22 DIAGNOSIS — K219 Gastro-esophageal reflux disease without esophagitis: Secondary | ICD-10-CM | POA: Diagnosis not present

## 2020-06-22 DIAGNOSIS — C189 Malignant neoplasm of colon, unspecified: Secondary | ICD-10-CM | POA: Diagnosis not present

## 2020-06-22 DIAGNOSIS — K317 Polyp of stomach and duodenum: Secondary | ICD-10-CM

## 2020-06-22 DIAGNOSIS — D122 Benign neoplasm of ascending colon: Secondary | ICD-10-CM | POA: Diagnosis not present

## 2020-06-22 DIAGNOSIS — K3189 Other diseases of stomach and duodenum: Secondary | ICD-10-CM | POA: Diagnosis not present

## 2020-06-22 DIAGNOSIS — K648 Other hemorrhoids: Secondary | ICD-10-CM | POA: Insufficient documentation

## 2020-06-22 DIAGNOSIS — Z881 Allergy status to other antibiotic agents status: Secondary | ICD-10-CM | POA: Diagnosis not present

## 2020-06-22 DIAGNOSIS — K644 Residual hemorrhoidal skin tags: Secondary | ICD-10-CM

## 2020-06-22 DIAGNOSIS — K635 Polyp of colon: Secondary | ICD-10-CM | POA: Insufficient documentation

## 2020-06-22 DIAGNOSIS — Z79899 Other long term (current) drug therapy: Secondary | ICD-10-CM | POA: Diagnosis not present

## 2020-06-22 DIAGNOSIS — E039 Hypothyroidism, unspecified: Secondary | ICD-10-CM | POA: Insufficient documentation

## 2020-06-22 DIAGNOSIS — Z880 Allergy status to penicillin: Secondary | ICD-10-CM | POA: Insufficient documentation

## 2020-06-22 DIAGNOSIS — C182 Malignant neoplasm of ascending colon: Secondary | ICD-10-CM

## 2020-06-22 DIAGNOSIS — D490 Neoplasm of unspecified behavior of digestive system: Secondary | ICD-10-CM

## 2020-06-22 DIAGNOSIS — D123 Benign neoplasm of transverse colon: Secondary | ICD-10-CM | POA: Diagnosis not present

## 2020-06-22 DIAGNOSIS — K6389 Other specified diseases of intestine: Secondary | ICD-10-CM | POA: Diagnosis not present

## 2020-06-22 DIAGNOSIS — D132 Benign neoplasm of duodenum: Secondary | ICD-10-CM | POA: Insufficient documentation

## 2020-06-22 DIAGNOSIS — Z87891 Personal history of nicotine dependence: Secondary | ICD-10-CM | POA: Diagnosis not present

## 2020-06-22 HISTORY — PX: POLYPECTOMY: SHX149

## 2020-06-22 HISTORY — PX: BIOPSY: SHX5522

## 2020-06-22 HISTORY — DX: Hypothyroidism, unspecified: E03.9

## 2020-06-22 HISTORY — DX: Depression, unspecified: F32.A

## 2020-06-22 HISTORY — DX: Gastro-esophageal reflux disease without esophagitis: K21.9

## 2020-06-22 HISTORY — PX: POLYPECTOMY: SHX5525

## 2020-06-22 HISTORY — PX: COLONOSCOPY WITH PROPOFOL: SHX5780

## 2020-06-22 HISTORY — PX: ESOPHAGOGASTRODUODENOSCOPY (EGD) WITH PROPOFOL: SHX5813

## 2020-06-22 SURGERY — COLONOSCOPY WITH PROPOFOL
Anesthesia: General

## 2020-06-22 MED ORDER — LACTATED RINGERS IV SOLN: INTRAVENOUS | Status: DC

## 2020-06-22 MED ORDER — SPOT INK MARKER SYRINGE KIT
PACK | SUBMUCOSAL | Status: DC | PRN
  Administered 2020-06-22: 5 mL via SUBMUCOSAL

## 2020-06-22 MED ORDER — SPOT INK MARKER SYRINGE KIT
PACK | SUBMUCOSAL | Status: AC
  Filled 2020-06-22: qty 5

## 2020-06-22 MED ORDER — PROPOFOL 500 MG/50ML IV EMUL
INTRAVENOUS | Status: DC | PRN
  Administered 2020-06-22: 150 ug/kg/min via INTRAVENOUS

## 2020-06-22 MED ORDER — PROPOFOL 10 MG/ML IV BOLUS
INTRAVENOUS | Status: DC | PRN
  Administered 2020-06-22: 70 mg via INTRAVENOUS
  Administered 2020-06-22: 10 mg via INTRAVENOUS

## 2020-06-22 MED ORDER — LIDOCAINE HCL (CARDIAC) PF 100 MG/5ML IV SOSY
PREFILLED_SYRINGE | INTRAVENOUS | Status: DC | PRN
  Administered 2020-06-22: 50 mg via INTRAVENOUS

## 2020-06-22 NOTE — Op Note (Signed)
[facility] Patient Name: Bianca Vasquez Procedure Date: 06/22/2020 8:08 AM MRN: [id number] Date of Birth: 16-Mar-1951 Attending MD: [name] ,  CSN: [id number] Age: 70 Admit Type: Outpatient Procedure:                Upper GI endoscopy Indications:              Iron deficiency anemia Providers:                [name], Jeanann Lewandowsky. Persais Seller, RN, Aram Candela Referring MD:              Medicines:                Monitored Anesthesia Care Complications:            No immediate complications. Estimated Blood Loss:     Estimated blood loss: none. Procedure:                Pre-Anesthesia Assessment:                           - Prior to the procedure, a History and Physical                            was performed, and patient medications, allergies                            and sensitivities were reviewed. The patient's                            tolerance of previous anesthesia was reviewed.                           - The risks and benefits of the procedure and the                            sedation options and risks were discussed with the                            patient. All questions were answered and informed                            consent was obtained.                           - ASA Grade Assessment: II - A patient with mild                            systemic disease.                           After obtaining informed consent, the endoscope was                            passed under direct vision. Throughout the  procedure, the patient's blood pressure, pulse, and                            oxygen saturations were monitored continuously. The                            [id number]-H190 ([id number]) scope was introduced through the                            mouth, and advanced to the second part of duodenum.                            The upper GI endoscopy was accomplished without                             difficulty. The patient tolerated the procedure                            well. Scope In: 8:25:33 AM Scope Out: 8:47:48 AM Total Procedure Duration: 0 hours 22 minutes 15 seconds  Findings:      The examined esophagus was normal.      Multiple 3 to 6 mm sessile polyps with no bleeding and no stigmata of       recent bleeding were found in the gastric fundus and in the gastric       body. Six polyps were removed with a hot snare. Resection and retrieval       were complete with the aid of a Roth net.      Localized nodular mucosa was found in the second portion of the       duodenum. Biopsies were taken with a cold forceps for histology.      The rest of the examined duodenum was normal. Biopsies for histology       were taken with a cold forceps for evaluation of celiac disease. Impression:               - Normal esophagus.                           - Multiple gastric polyps. Resected and retrieved.                           - Nodular mucosa in the second portion of the                            duodenum. Biopsied.                           - Normal rest of the examined duodenum. Biopsied. Moderate Sedation:      Per Anesthesia Care Recommendation:           - Discharge patient to home (ambulatory).                           - Resume previous diet.                           -  Await pathology results. Procedure Code(s):        --- Professional ---                           [phone], Esophagogastroduodenoscopy, flexible,                            transoral; with removal of tumor(s), polyp(s), or                            other lesion(s) by snare technique                           [id number], 40, Esophagogastroduodenoscopy, flexible,                            transoral; with biopsy, single or multiple Diagnosis Code(s):        --- Professional ---                           K31.7, Polyp of stomach and duodenum                           K31.89, Other diseases of stomach and duodenum                            D50.9, Iron deficiency anemia, unspecified CPT copyright 2019 [employer]. All rights reserved. The codes documented in this report are preliminary and upon coder review may  be revised to meet current compliance requirements. [name], MD [name],  06/22/2020 9:32:06 AM This report has been signed electronically. Number of Addenda: 0

## 2020-06-22 NOTE — Discharge Instructions (Signed)
You are being discharged to home.  Resume your previous diet.  We are waiting for your pathology results.  Your physician has recommended a repeat colonoscopy for surveillance based on pathology results.  Obtain chest/abdominal/pelvis CT scan with IV contrast.   Colonoscopy, Adult, Care After This sheet gives you information about how to care for yourself after your procedure. Your doctor may also give you more specific instructions. If you have problems or questions, call your doctor. What can I expect after the procedure? After the procedure, it is common to have:  A small amount of blood in your poop (stool) for 24 hours.  Some gas.  Mild cramping or bloating in your belly (abdomen). Follow these instructions at home: Eating and drinking  Drink enough fluid to keep your pee (urine) pale yellow.  Follow instructions from your doctor about what you cannot eat or drink.  Return to your normal diet as told by your doctor. Avoid heavy or fried foods that are hard to digest.   Activity  Rest as told by your doctor.  Do not sit for a long time without moving. Get up to take short walks every 1-2 hours. This is important. Ask for help if you feel weak or unsteady.  Return to your normal activities as told by your doctor. Ask your doctor what activities are safe for you. To help cramping and bloating:  Try walking around.  Put heat on your belly as told by your doctor. Use the heat source that your doctor recommends, such as a moist heat pack or a heating pad. ? Put a towel between your skin and the heat source. ? Leave the heat on for 20-30 minutes. ? Remove the heat if your skin turns bright red. This is very important if you are unable to feel pain, heat, or cold. You may have a greater risk of getting burned.   General instructions  If you were given a medicine to help you relax (sedative) during your procedure, it can affect you for many hours. Do not drive or use machinery  until your doctor says that it is safe.  For the first 24 hours after the procedure: ? Do not sign important documents. ? Do not drink alcohol. ? Do your daily activities more slowly than normal. ? Eat foods that are soft and easy to digest.  Take over-the-counter or prescription medicines only as told by your doctor.  Keep all follow-up visits as told by your doctor. This is important. Contact a doctor if:  You have blood in your poop 2-3 days after the procedure. Get help right away if:  You have more than a small amount of blood in your poop.  You see large clumps of tissue (blood clots) in your poop.  Your belly is swollen.  You feel like you may vomit (nauseous).  You vomit.  You have a fever.  You have belly pain that gets worse, and medicine does not help your pain. Summary  After the procedure, it is common to have a small amount of blood in your poop. You may also have mild cramping and bloating in your belly.  If you were given a medicine to help you relax (sedative) during your procedure, it can affect you for many hours. Do not drive or use machinery until your doctor says that it is safe.  Get help right away if you have a lot of blood in your poop, feel like you may vomit, have a fever, or have  more belly pain. This information is not intended to replace advice given to you by your health care provider. Make sure you discuss any questions you have with your health care provider. Document Revised: 02/14/2019 Document Reviewed: 11/04/2018 Elsevier Patient Education  2021 [place].   Upper Endoscopy, Adult, Care After This sheet gives you information about how to care for yourself after your procedure. Your health care provider may also give you more specific instructions. If you have problems or questions, contact your health care provider. What can I expect after the procedure? After the procedure, it is common to have:  A sore throat.  Mild stomach  pain or discomfort.  Bloating.  Nausea. Follow these instructions at home:  Follow instructions from your health care provider about what to eat or drink after your procedure.  Return to your normal activities as told by your health care provider. Ask your health care provider what activities are safe for you.  Take over-the-counter and prescription medicines only as told by your health care provider.  If you were given a sedative during the procedure, it can affect you for several hours. Do not drive or operate machinery until your health care provider says that it is safe.  Keep all follow-up visits as told by your health care provider. This is important.   Contact a health care provider if you have:  A sore throat that lasts longer than one day.  Trouble swallowing. Get help right away if:  You vomit blood or your vomit looks like coffee grounds.  You have: ? A fever. ? Bloody, black, or tarry stools. ? A severe sore throat or you cannot swallow. ? Difficulty breathing. ? Severe pain in your chest or abdomen. Summary  After the procedure, it is common to have a sore throat, mild stomach discomfort, bloating, and nausea.  If you were given a sedative during the procedure, it can affect you for several hours. Do not drive or operate machinery until your health care provider says that it is safe.  Follow instructions from your health care provider about what to eat or drink after your procedure.  Return to your normal activities as told by your health care provider. This information is not intended to replace advice given to you by your health care provider. Make sure you discuss any questions you have with your health care provider. Document Revised: 04/08/2019 Document Reviewed: 09/10/2017 Elsevier Patient Education  2021 [place].   Colon Polyps  Colon polyps are tissue growths inside the colon, which is part of the large intestine. They are one of the types of  polyps that can grow in the body. A polyp may be a round bump or a mushroom-shaped growth. You could have one polyp or more than one. Most colon polyps are noncancerous (benign). However, some colon polyps can become cancerous over time. Finding and removing the polyps early can help prevent this. What are the causes? The exact cause of colon polyps is not known. What increases the risk? The following factors may make you more likely to develop this condition:  Having a family history of colorectal cancer or colon polyps.  Being older than 72 years of age.  Being younger than 72 years of age and having a significant family history of colorectal cancer or colon polyps or a genetic condition that puts you at higher risk of getting colon polyps.  Having inflammatory bowel disease, such as ulcerative colitis or Crohn's disease.  Having certain conditions passed  from parent to child (hereditary conditions), such as: ? Familial adenomatous polyposis (FAP). ? Lynch syndrome. ? Turcot syndrome. ? Peutz-Jeghers syndrome. ? MUTYH-associated polyposis (MAP).  Being overweight.  Certain lifestyle factors. These include smoking cigarettes, drinking too much alcohol, not getting enough exercise, and eating a diet that is high in fat and red meat and low in fiber.  Having had childhood cancer that was treated with radiation of the abdomen. What are the signs or symptoms? Many times, there are no symptoms. If you have symptoms, they may include:  Blood coming from the rectum during a bowel movement.  Blood in the stool (feces). The blood may be bright red or very dark in color.  Pain in the abdomen.  A change in bowel habits, such as constipation or diarrhea. How is this diagnosed? This condition is diagnosed with a colonoscopy. This is a procedure in which a lighted, flexible scope is inserted into the opening between the buttocks (anus) and then passed into the colon to examine the area.  Polyps are sometimes found when a colonoscopy is done as part of routine cancer screening tests. How is this treated? This condition is treated by removing any polyps that are found. Most polyps can be removed during a colonoscopy. Those polyps will then be tested for cancer. Additional treatment may be needed depending on the results of testing. Follow these instructions at home: Eating and drinking  Eat foods that are high in fiber, such as fruits, vegetables, and whole grains.  Eat foods that are high in calcium and vitamin D, such as milk, cheese, yogurt, eggs, liver, fish, and broccoli.  Limit foods that are high in fat, such as fried foods and desserts.  Limit the amount of red meat, precooked or cured meat, or other processed meat that you eat, such as hot dogs, sausages, bacon, or meat loaves.  Limit sugary drinks.   Lifestyle  Maintain a healthy weight, or lose weight if recommended by your health care provider.  Exercise every day or as told by your health care provider.  Do not use any products that contain nicotine or tobacco, such as cigarettes, e-cigarettes, and chewing tobacco. If you need help quitting, ask your health care provider.  Do not drink alcohol if: ? Your health care provider tells you not to drink. ? You are pregnant, may be pregnant, or are planning to become pregnant.  If you drink alcohol: ? Limit how much you use to:  0-1 drink a day for women.  0-2 drinks a day for men. ? Know how much alcohol is in your drink. In the [place] one drink equals one 12 oz bottle of beer (355 mL), one 5 oz glass of wine (148 mL), or one 1 oz glass of hard liquor (44 mL). General instructions  Take over-the-counter and prescription medicines only as told by your health care provider.  Keep all follow-up visits. This is important. This includes having regularly scheduled colonoscopies. Talk to your health care provider about when you need a colonoscopy. Contact a  health care provider if:  You have new or worsening bleeding during a bowel movement.  You have new or increased blood in your stool.  You have a change in bowel habits.  You lose weight for no known reason. Summary  Colon polyps are tissue growths inside the colon, which is part of the large intestine. They are one type of polyp that can grow in the body.  Most colon polyps are noncancerous (benign),  but some can become cancerous over time.  This condition is diagnosed with a colonoscopy.  This condition is treated by removing any polyps that are found. Most polyps can be removed during a colonoscopy. This information is not intended to replace advice given to you by your health care provider. Make sure you discuss any questions you have with your health care provider. Document Revised: 07/30/2019 Document Reviewed: 07/30/2019 Elsevier Patient Education  2021 [place].   Hemorrhoids Hemorrhoids are swollen veins in and around the rectum or anus. There are two types of hemorrhoids:  Internal hemorrhoids. These occur in the veins that are just inside the rectum. They may poke through to the outside and become irritated and painful.  External hemorrhoids. These occur in the veins that are outside the anus and can be felt as a painful swelling or hard lump near the anus. Most hemorrhoids do not cause serious problems, and they can be managed with home treatments such as diet and lifestyle changes. If home treatments do not help the symptoms, procedures can be done to shrink or remove the hemorrhoids. What are the causes? This condition is caused by increased pressure in the anal area. This pressure may result from various things, including:  Constipation.  Straining to have a bowel movement.  Diarrhea.  Pregnancy.  Obesity.  Sitting for long periods of time.  Heavy lifting or other activity that causes you to strain.  Anal sex.  Riding a bike for a long period of  time. What are the signs or symptoms? Symptoms of this condition include:  Pain.  Anal itching or irritation.  Rectal bleeding.  Leakage of stool (feces).  Anal swelling.  One or more lumps around the anus. How is this diagnosed? This condition can often be diagnosed through a visual exam. Other exams or tests may also be done, such as:  An exam that involves feeling the rectal area with a gloved hand (digital rectal exam).  An exam of the anal canal that is done using a small tube (anoscope).  A blood test, if you have lost a significant amount of blood.  A test to look inside the colon using a flexible tube with a camera on the end (sigmoidoscopy or colonoscopy). How is this treated? This condition can usually be treated at home. However, various procedures may be done if dietary changes, lifestyle changes, and other home treatments do not help your symptoms. These procedures can help make the hemorrhoids smaller or remove them completely. Some of these procedures involve surgery, and others do not. Common procedures include:  Rubber band ligation. Rubber bands are placed at the base of the hemorrhoids to cut off their blood supply.  Sclerotherapy. Medicine is injected into the hemorrhoids to shrink them.  Infrared coagulation. A type of light energy is used to get rid of the hemorrhoids.  Hemorrhoidectomy surgery. The hemorrhoids are surgically removed, and the veins that supply them are tied off.  Stapled hemorrhoidopexy surgery. The surgeon staples the base of the hemorrhoid to the rectal wall. Follow these instructions at home: Eating and drinking  Eat foods that have a lot of fiber in them, such as whole grains, beans, nuts, fruits, and vegetables.  Ask your health care provider about taking products that have added fiber (fiber supplements).  Reduce the amount of fat in your diet. You can do this by eating low-fat dairy products, eating less red meat, and avoiding  processed foods.  Drink enough fluid to keep your urine  pale yellow.   Managing pain and swelling  Take warm sitz baths for 20 minutes, 3-4 times a day to ease pain and discomfort. You may do this in a bathtub or using a portable sitz bath that fits over the toilet.  If directed, apply ice to the affected area. Using ice packs between sitz baths may be helpful. ? Put ice in a plastic bag. ? Place a towel between your skin and the bag. ? Leave the ice on for 20 minutes, 2-3 times a day.   General instructions  Take over-the-counter and prescription medicines only as told by your health care provider.  Use medicated creams or suppositories as told.  Get regular exercise. Ask your health care provider how much and what kind of exercise is best for you. In general, you should do moderate exercise for at least 30 minutes on most days of the week (150 minutes each week). This can include activities such as walking, biking, or yoga.  Go to the bathroom when you have the urge to have a bowel movement. Do not wait.  Avoid straining to have bowel movements.  Keep the anal area dry and clean. Use wet toilet paper or moist towelettes after a bowel movement.  Do not sit on the toilet for long periods of time. This increases blood pooling and pain.  Keep all follow-up visits as told by your health care provider. This is important. Contact a health care provider if you have:  Increasing pain and swelling that are not controlled by treatment or medicine.  Difficulty having a bowel movement, or you are unable to have a bowel movement.  Pain or inflammation outside the area of the hemorrhoids. Get help right away if you have:  Uncontrolled bleeding from your rectum. Summary  Hemorrhoids are swollen veins in and around the rectum or anus.  Most hemorrhoids can be managed with home treatments such as diet and lifestyle changes.  Taking warm sitz baths can help ease pain and discomfort.  In  severe cases, procedures or surgery can be done to shrink or remove the hemorrhoids. This information is not intended to replace advice given to you by your health care provider. Make sure you discuss any questions you have with your health care provider. Document Revised: 09/06/2018 Document Reviewed: 08/30/2017 Elsevier Patient Education  [place].

## 2020-06-22 NOTE — Anesthesia Preprocedure Evaluation (Addendum)
Anesthesia Evaluation  Patient identified by MRN, date of birth, ID band Patient awake    Reviewed: Allergy & Precautions, NPO status , Patient's Chart, lab work & pertinent test results  History of Anesthesia Complications Negative for: history of anesthetic complications  Airway Mallampati: III  TM Distance: >3 FB Neck ROM: Full    Dental  (+) Dental Advisory Given   Pulmonary former smoker,    Pulmonary exam normal breath sounds clear to auscultation       Cardiovascular Exercise Tolerance: Good Normal cardiovascular exam Rhythm:Regular Rate:Normal     Neuro/Psych PSYCHIATRIC DISORDERS Depression    GI/Hepatic Neg liver ROS, GERD  Medicated,  Endo/Other  Hypothyroidism   Renal/GU negative Renal ROS  negative genitourinary   Musculoskeletal negative musculoskeletal ROS (+)   Abdominal   Peds negative pediatric ROS (+)  Hematology  (+) anemia ,   Anesthesia Other Findings   Reproductive/Obstetrics negative OB ROS                            Anesthesia Physical Anesthesia Plan  ASA: II  Anesthesia Plan: General   Post-op Pain Management:    Induction: Intravenous  PONV Risk Score and Plan: TIVA  Airway Management Planned: Nasal Cannula and Natural Airway  Additional Equipment:   Intra-op Plan:   Post-operative Plan:   Informed Consent: I have reviewed the patients History and Physical, chart, labs and discussed the procedure including the risks, benefits and alternatives for the proposed anesthesia with the patient or authorized representative who has indicated his/her understanding and acceptance.     Dental advisory given  Plan Discussed with: CRNA and Surgeon  Anesthesia Plan Comments:         Anesthesia Quick Evaluation

## 2020-06-22 NOTE — Interval H&P Note (Signed)
History and Physical Interval Note:  06/22/2020 8:13 AM  Bianca Vasquez is a 70 y.o. female with PMH depression, hypothyroidism, GERD, HLD, who presents for evaluation of iron deficiency anemia.  The patient states that she was found to have iron deficiency anemia during recent blood work-up with a hemoglobin of 10.8.  She was also found to have low ferritin and iron stores.  Has not started iron supplementation yet.  Denies any melena or hematochezia recently but she had an episode of scant fresh blood in her stool.  She also has presented some episodes of intermittent diarrhea throughout her life without a clear cause.  BP (!) 169/72    Pulse 95    Temp 97.9 F (36.6 C) (Oral)    Resp (!) 21    Ht 5\' 5"  (1.651 m)    Wt 83.9 kg    SpO2 97%    BMI 30.79 kg/m  GENERAL: The patient is AO x3, in no acute distress. HEENT: Head is normocephalic and atraumatic. EOMI are intact. Mouth is well hydrated and without lesions. NECK: Supple. No masses LUNGS: Clear to auscultation. No presence of rhonchi/wheezing/rales. Adequate chest expansion HEART: RRR, normal s1 and s2. ABDOMEN: Soft, nontender, no guarding, no peritoneal signs, and nondistended. BS +. No masses. EXTREMITIES: Without any cyanosis, clubbing, rash, lesions or edema. NEUROLOGIC: AOx3, no focal motor deficit. SKIN: no jaundice, no rashes   [name]  has presented today for surgery, with the diagnosis of Iron Deficiency Anemia.  The various methods of treatment have been discussed with the patient and family. After consideration of risks, benefits and other options for treatment, the patient has consented to  Procedure(s) with comments: COLONOSCOPY WITH PROPOFOL (N/A) - am ESOPHAGOGASTRODUODENOSCOPY (EGD) WITH PROPOFOL (N/A) as a surgical intervention.  The patient's history has been reviewed, patient examined, no change in status, stable for surgery.  I have reviewed the patient's chart and labs.  Questions were answered  to the patient's satisfaction.     [name]

## 2020-06-22 NOTE — Anesthesia Postprocedure Evaluation (Signed)
Anesthesia Post Note  Patient: Bianca Vasquez  Procedure(s) Performed: COLONOSCOPY WITH PROPOFOL (N/A ) ESOPHAGOGASTRODUODENOSCOPY (EGD) WITH PROPOFOL (N/A ) BIOPSY POLYPECTOMY POLYPECTOMY INTESTINAL  Patient location during evaluation: Phase II Anesthesia Type: General Level of consciousness: awake and alert Pain management: satisfactory to patient Vital Signs Assessment: post-procedure vital signs reviewed and stable Respiratory status: spontaneous breathing and respiratory function stable Cardiovascular status: stable and blood pressure returned to baseline Postop Assessment: no apparent nausea or vomiting Anesthetic complications: no   No complications documented.   Last Vitals:  Vitals:   06/22/20 0752  BP: (!) 169/72  Pulse: 95  Resp: (!) 21  Temp: 36.6 C  SpO2: 97%    Last Pain:  Vitals:   06/22/20 0819  TempSrc:   PainSc: 0-No pain                 Karna Dupes

## 2020-06-22 NOTE — Transfer of Care (Signed)
Immediate Anesthesia Transfer of Care Note  Patient: Bianca Vasquez  Procedure(s) Performed: COLONOSCOPY WITH PROPOFOL (N/A ) ESOPHAGOGASTRODUODENOSCOPY (EGD) WITH PROPOFOL (N/A ) BIOPSY POLYPECTOMY POLYPECTOMY INTESTINAL  Patient Location: PACU  Anesthesia Type:General  Level of Consciousness: awake, alert  and oriented  Airway & Oxygen Therapy: Patient Spontanous Breathing  Post-op Assessment: Report given to RN and Post -op Vital signs reviewed and stable  Post vital signs: Reviewed and stable  Last Vitals:  Vitals Value Taken Time  BP    Temp    Pulse    Resp    SpO2      Last Pain:  Vitals:   06/22/20 0819  TempSrc:   PainSc: 0-No pain      Patients Stated Pain Goal: 8 (40/98/11 [id number])  Complications: No complications documented.

## 2020-06-22 NOTE — Progress Notes (Signed)
cre 

## 2020-06-22 NOTE — Op Note (Signed)
[facility] Patient Name: Bianca Vasquez Procedure Date: 06/22/2020 8:50 AM MRN: [id number] Date of Birth: 28-Dec-1950 Attending MD: [name] ,  CSN: [id number] Age: 70 Admit Type: Outpatient Procedure:                Colonoscopy Indications:              Iron deficiency anemia Providers:                [name], Gwenlyn Fudge, RN, Aram Candela Referring MD:              Medicines:                Monitored Anesthesia Care Complications:            No immediate complications. Estimated Blood Loss:     Estimated blood loss: none. Procedure:                Pre-Anesthesia Assessment:                           - Prior to the procedure, a History and Physical                            was performed, and patient medications, allergies                            and sensitivities were reviewed. The patient's                            tolerance of previous anesthesia was reviewed.                           - The risks and benefits of the procedure and the                            sedation options and risks were discussed with the                            patient. All questions were answered and informed                            consent was obtained.                           - ASA Grade Assessment: II - A patient with mild                            systemic disease.                           After obtaining informed consent, the colonoscope                            was passed under direct vision. Throughout the                            procedure, the patient's blood pressure, pulse, and  oxygen saturations were monitored continuously. The                            [id number]-[id number] ([id number]) scope was introduced through                            the anus and advanced to the the terminal ileum.                            The colonoscopy was performed without difficulty.                            The patient tolerated the procedure well. The                             quality of the bowel preparation was good. Scope                            withdrawal time was 20 minutes. Scope In: 9:01:12 AM Scope Out: 9:25:52 AM Total Procedure Duration: 0 hours 24 minutes 40 seconds  Findings:      The perianal and digital rectal examinations were normal.      The terminal ileum appeared normal.      An infiltrative and ulcerated non-obstructing large mass was found in       the proximal ascending colon. The mass was partially circumferential       (involving one-third of the lumen circumference). The mass measured five       cm in length. In addition, its diameter measured five cm. No bleeding       was present but the lesion was friable. Biopsies were taken with a cold       forceps for histology. 5 cm distal from the mass an area was       successfully injected with 5 mL Niger ink for tattooing.      Two sessile polyps were found in the transverse colon. The polyps were 4       mm in size. These polyps were removed with a cold snare. Resection and       retrieval were complete.      Non-bleeding internal hemorrhoids were found during retroflexion. The       hemorrhoids were small. Impression:               - The examined portion of the ileum was normal.                           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 Biopsied. Injected.                           - Two 4 mm polyps in the transverse colon, removed                            with a cold snare. Resected and retrieved.                           -  Non-bleeding internal hemorrhoids. Moderate Sedation:      Per Anesthesia Care Recommendation:           - Discharge patient to home (ambulatory).                           - Resume previous diet.                           - Await pathology results.                           - Repeat colonoscopy for surveillance based on                            pathology results.                            - Check CEA level                           - Obtain chest/abdominal/pelvis CT scan with IV                            contrast. Procedure Code(s):        --- Professional ---                           [phone], Colonoscopy, flexible; with removal of                            tumor(s), polyp(s), or other lesion(s) by snare                            technique                           45381, Colonoscopy, flexible; with directed                            submucosal injection(s), any substance                           45380, 59, Colonoscopy, flexible; with biopsy,                            single or multiple Diagnosis Code(s):        --- Professional ---                           G18.2, Other hemorrhoids                           D49.0, Neoplasm of unspecified behavior of                            digestive system  K63.5, Polyp of colon                           D50.9, Iron deficiency anemia, unspecified CPT copyright 2019 American Medical Association. All rights reserved. The codes documented in this report are preliminary and upon coder review may  be revised to meet current compliance requirements. [name], MD [name],  06/22/2020 9:39:11 AM This report has been signed electronically. Number of Addenda: 0

## 2020-06-23 LAB — CEA: CEA: 1.8 ng/mL (ref 0.0–4.7)

## 2020-06-24 LAB — SURGICAL PATHOLOGY

## 2020-06-28 ENCOUNTER — Other Ambulatory Visit: Payer: Self-pay

## 2020-06-28 ENCOUNTER — Ambulatory Visit (HOSPITAL_COMMUNITY)
Admission: RE | Admit: 2020-06-28 | Discharge: 2020-06-28 | Disposition: A | Discharge status: Home / Self Care | Payer: Medicare Other | Source: Ambulatory Visit | Attending: Gastroenterology | Admitting: Gastroenterology

## 2020-06-28 ENCOUNTER — Encounter (HOSPITAL_COMMUNITY): Payer: Self-pay | Admitting: Gastroenterology

## 2020-06-28 DIAGNOSIS — N83292 Other ovarian cyst, left side: Secondary | ICD-10-CM | POA: Diagnosis not present

## 2020-06-28 DIAGNOSIS — I700 Atherosclerosis of aorta: Secondary | ICD-10-CM | POA: Diagnosis not present

## 2020-06-28 DIAGNOSIS — I2510 Atherosclerotic heart disease of native coronary artery without angina pectoris: Secondary | ICD-10-CM | POA: Diagnosis not present

## 2020-06-28 DIAGNOSIS — C182 Malignant neoplasm of ascending colon: Secondary | ICD-10-CM | POA: Diagnosis not present

## 2020-06-28 DIAGNOSIS — C189 Malignant neoplasm of colon, unspecified: Secondary | ICD-10-CM | POA: Diagnosis not present

## 2020-06-28 DIAGNOSIS — K6389 Other specified diseases of intestine: Secondary | ICD-10-CM | POA: Diagnosis not present

## 2020-06-28 DIAGNOSIS — R911 Solitary pulmonary nodule: Secondary | ICD-10-CM | POA: Diagnosis not present

## 2020-06-28 DIAGNOSIS — N898 Other specified noninflammatory disorders of vagina: Secondary | ICD-10-CM | POA: Diagnosis not present

## 2020-06-28 LAB — POCT I-STAT CREATININE: Creatinine, Ser: 0.8 mg/dL (ref 0.44–1.00)

## 2020-06-28 MED ORDER — IOHEXOL 300 MG/ML  SOLN
100.0000 mL | Freq: Once | INTRAMUSCULAR | Status: AC | PRN
  Administered 2020-06-28: 100 mL via INTRAVENOUS

## 2020-07-02 DIAGNOSIS — C182 Malignant neoplasm of ascending colon: Secondary | ICD-10-CM | POA: Diagnosis not present

## 2020-07-09 DIAGNOSIS — K317 Polyp of stomach and duodenum: Secondary | ICD-10-CM | POA: Diagnosis not present

## 2020-07-09 DIAGNOSIS — D132 Benign neoplasm of duodenum: Secondary | ICD-10-CM | POA: Diagnosis not present

## 2020-07-09 DIAGNOSIS — K635 Polyp of colon: Secondary | ICD-10-CM | POA: Diagnosis not present

## 2020-07-15 ENCOUNTER — Encounter (HOSPITAL_COMMUNITY): Payer: Self-pay

## 2020-07-15 NOTE — Progress Notes (Signed)
I have called the patient to place an introductory phone call. Patient reports needing to potentially reschedule and was not available for further discussion at this time. I provided my contact information and the contact information for clinic schedulers. Patient appreciative of my call.

## 2020-07-19 ENCOUNTER — Ambulatory Visit (HOSPITAL_COMMUNITY): Payer: Medicare Other | Admitting: Hematology

## 2020-07-23 DIAGNOSIS — S97112A Crushing injury of left great toe, initial encounter: Secondary | ICD-10-CM | POA: Diagnosis not present

## 2020-07-23 HISTORY — PX: COLECTOMY: SHX59

## 2020-07-24 DIAGNOSIS — S92412A Displaced fracture of proximal phalanx of left great toe, initial encounter for closed fracture: Secondary | ICD-10-CM | POA: Diagnosis not present

## 2020-07-24 DIAGNOSIS — S92402B Displaced unspecified fracture of left great toe, initial encounter for open fracture: Secondary | ICD-10-CM | POA: Diagnosis not present

## 2020-07-24 DIAGNOSIS — S97102A Crushing injury of unspecified left toe(s), initial encounter: Secondary | ICD-10-CM | POA: Diagnosis not present

## 2020-07-27 DIAGNOSIS — D509 Iron deficiency anemia, unspecified: Secondary | ICD-10-CM | POA: Diagnosis not present

## 2020-07-27 DIAGNOSIS — Z01810 Encounter for preprocedural cardiovascular examination: Secondary | ICD-10-CM | POA: Diagnosis not present

## 2020-07-27 DIAGNOSIS — S92412D Displaced fracture of proximal phalanx of left great toe, subsequent encounter for fracture with routine healing: Secondary | ICD-10-CM | POA: Diagnosis not present

## 2020-07-27 DIAGNOSIS — K219 Gastro-esophageal reflux disease without esophagitis: Secondary | ICD-10-CM | POA: Diagnosis not present

## 2020-07-27 DIAGNOSIS — Z01818 Encounter for other preprocedural examination: Secondary | ICD-10-CM | POA: Diagnosis not present

## 2020-07-27 DIAGNOSIS — N949 Unspecified condition associated with female genital organs and menstrual cycle: Secondary | ICD-10-CM | POA: Diagnosis not present

## 2020-07-27 DIAGNOSIS — Z01812 Encounter for preprocedural laboratory examination: Secondary | ICD-10-CM | POA: Diagnosis not present

## 2020-07-27 DIAGNOSIS — C182 Malignant neoplasm of ascending colon: Secondary | ICD-10-CM | POA: Diagnosis not present

## 2020-07-27 DIAGNOSIS — F329 Major depressive disorder, single episode, unspecified: Secondary | ICD-10-CM | POA: Diagnosis not present

## 2020-07-27 DIAGNOSIS — E039 Hypothyroidism, unspecified: Secondary | ICD-10-CM | POA: Diagnosis not present

## 2020-07-27 DIAGNOSIS — N951 Menopausal and female climacteric states: Secondary | ICD-10-CM | POA: Diagnosis not present

## 2020-07-27 DIAGNOSIS — E785 Hyperlipidemia, unspecified: Secondary | ICD-10-CM | POA: Diagnosis not present

## 2020-07-28 DIAGNOSIS — S97102A Crushing injury of unspecified left toe(s), initial encounter: Secondary | ICD-10-CM | POA: Diagnosis not present

## 2020-07-29 DIAGNOSIS — C182 Malignant neoplasm of ascending colon: Secondary | ICD-10-CM | POA: Diagnosis not present

## 2020-07-29 DIAGNOSIS — N3941 Urge incontinence: Secondary | ICD-10-CM | POA: Diagnosis not present

## 2020-08-05 DIAGNOSIS — E039 Hypothyroidism, unspecified: Secondary | ICD-10-CM | POA: Diagnosis not present

## 2020-08-05 DIAGNOSIS — Z87891 Personal history of nicotine dependence: Secondary | ICD-10-CM | POA: Diagnosis not present

## 2020-08-05 DIAGNOSIS — K219 Gastro-esophageal reflux disease without esophagitis: Secondary | ICD-10-CM | POA: Diagnosis not present

## 2020-08-05 DIAGNOSIS — E785 Hyperlipidemia, unspecified: Secondary | ICD-10-CM | POA: Diagnosis not present

## 2020-08-05 DIAGNOSIS — C182 Malignant neoplasm of ascending colon: Secondary | ICD-10-CM | POA: Diagnosis not present

## 2020-08-05 DIAGNOSIS — Z01812 Encounter for preprocedural laboratory examination: Secondary | ICD-10-CM | POA: Diagnosis not present

## 2020-08-10 DIAGNOSIS — Z85828 Personal history of other malignant neoplasm of skin: Secondary | ICD-10-CM | POA: Diagnosis not present

## 2020-08-10 DIAGNOSIS — I2510 Atherosclerotic heart disease of native coronary artery without angina pectoris: Secondary | ICD-10-CM | POA: Diagnosis present

## 2020-08-10 DIAGNOSIS — Z6831 Body mass index (BMI) 31.0-31.9, adult: Secondary | ICD-10-CM | POA: Diagnosis not present

## 2020-08-10 DIAGNOSIS — Z87891 Personal history of nicotine dependence: Secondary | ICD-10-CM | POA: Diagnosis not present

## 2020-08-10 DIAGNOSIS — F32A Depression, unspecified: Secondary | ICD-10-CM | POA: Diagnosis not present

## 2020-08-10 DIAGNOSIS — E785 Hyperlipidemia, unspecified: Secondary | ICD-10-CM | POA: Diagnosis present

## 2020-08-10 DIAGNOSIS — D509 Iron deficiency anemia, unspecified: Secondary | ICD-10-CM | POA: Diagnosis not present

## 2020-08-10 DIAGNOSIS — D271 Benign neoplasm of left ovary: Secondary | ICD-10-CM | POA: Diagnosis not present

## 2020-08-10 DIAGNOSIS — G8918 Other acute postprocedural pain: Secondary | ICD-10-CM | POA: Diagnosis not present

## 2020-08-10 DIAGNOSIS — N809 Endometriosis, unspecified: Secondary | ICD-10-CM | POA: Diagnosis not present

## 2020-08-10 DIAGNOSIS — Z5331 Laparoscopic surgical procedure converted to open procedure: Secondary | ICD-10-CM | POA: Diagnosis not present

## 2020-08-10 DIAGNOSIS — E039 Hypothyroidism, unspecified: Secondary | ICD-10-CM | POA: Diagnosis present

## 2020-08-10 DIAGNOSIS — C182 Malignant neoplasm of ascending colon: Secondary | ICD-10-CM | POA: Diagnosis present

## 2020-08-10 DIAGNOSIS — Z9049 Acquired absence of other specified parts of digestive tract: Secondary | ICD-10-CM | POA: Diagnosis not present

## 2020-08-10 DIAGNOSIS — Z6832 Body mass index (BMI) 32.0-32.9, adult: Secondary | ICD-10-CM | POA: Diagnosis not present

## 2020-08-10 DIAGNOSIS — Z90710 Acquired absence of both cervix and uterus: Secondary | ICD-10-CM | POA: Diagnosis not present

## 2020-08-10 DIAGNOSIS — K219 Gastro-esophageal reflux disease without esophagitis: Secondary | ICD-10-CM | POA: Diagnosis not present

## 2020-08-10 DIAGNOSIS — E669 Obesity, unspecified: Secondary | ICD-10-CM | POA: Diagnosis present

## 2020-08-10 DIAGNOSIS — N801 Endometriosis of ovary: Secondary | ICD-10-CM | POA: Diagnosis not present

## 2020-08-21 DIAGNOSIS — E039 Hypothyroidism, unspecified: Secondary | ICD-10-CM | POA: Diagnosis not present

## 2020-08-21 DIAGNOSIS — R109 Unspecified abdominal pain: Secondary | ICD-10-CM | POA: Diagnosis not present

## 2020-08-23 DIAGNOSIS — C182 Malignant neoplasm of ascending colon: Secondary | ICD-10-CM | POA: Diagnosis not present

## 2020-08-26 DIAGNOSIS — I10 Essential (primary) hypertension: Secondary | ICD-10-CM | POA: Diagnosis not present

## 2020-08-26 DIAGNOSIS — Z299 Encounter for prophylactic measures, unspecified: Secondary | ICD-10-CM | POA: Diagnosis not present

## 2020-08-26 DIAGNOSIS — I700 Atherosclerosis of aorta: Secondary | ICD-10-CM | POA: Diagnosis not present

## 2020-08-26 DIAGNOSIS — C189 Malignant neoplasm of colon, unspecified: Secondary | ICD-10-CM | POA: Diagnosis not present

## 2020-08-26 DIAGNOSIS — Z09 Encounter for follow-up examination after completed treatment for conditions other than malignant neoplasm: Secondary | ICD-10-CM | POA: Diagnosis not present

## 2020-09-06 DIAGNOSIS — N390 Urinary tract infection, site not specified: Secondary | ICD-10-CM | POA: Diagnosis not present

## 2020-09-06 DIAGNOSIS — R350 Frequency of micturition: Secondary | ICD-10-CM | POA: Diagnosis not present

## 2020-09-06 DIAGNOSIS — Z789 Other specified health status: Secondary | ICD-10-CM | POA: Diagnosis not present

## 2020-09-06 DIAGNOSIS — Z299 Encounter for prophylactic measures, unspecified: Secondary | ICD-10-CM | POA: Diagnosis not present

## 2020-09-06 DIAGNOSIS — I10 Essential (primary) hypertension: Secondary | ICD-10-CM | POA: Diagnosis not present

## 2020-09-06 DIAGNOSIS — C189 Malignant neoplasm of colon, unspecified: Secondary | ICD-10-CM | POA: Diagnosis not present

## 2020-09-13 DIAGNOSIS — M170 Bilateral primary osteoarthritis of knee: Secondary | ICD-10-CM | POA: Diagnosis not present

## 2020-09-21 DIAGNOSIS — C182 Malignant neoplasm of ascending colon: Secondary | ICD-10-CM | POA: Diagnosis not present

## 2020-09-22 DIAGNOSIS — S90212A Contusion of left great toe with damage to nail, initial encounter: Secondary | ICD-10-CM | POA: Diagnosis not present

## 2020-09-22 DIAGNOSIS — M79675 Pain in left toe(s): Secondary | ICD-10-CM | POA: Diagnosis not present

## 2020-09-22 DIAGNOSIS — M79672 Pain in left foot: Secondary | ICD-10-CM | POA: Diagnosis not present

## 2020-09-28 DIAGNOSIS — I10 Essential (primary) hypertension: Secondary | ICD-10-CM | POA: Diagnosis not present

## 2020-09-28 DIAGNOSIS — Z299 Encounter for prophylactic measures, unspecified: Secondary | ICD-10-CM | POA: Diagnosis not present

## 2020-09-28 DIAGNOSIS — N390 Urinary tract infection, site not specified: Secondary | ICD-10-CM | POA: Diagnosis not present

## 2020-09-29 DIAGNOSIS — M170 Bilateral primary osteoarthritis of knee: Secondary | ICD-10-CM | POA: Diagnosis not present

## 2020-10-27 DIAGNOSIS — N390 Urinary tract infection, site not specified: Secondary | ICD-10-CM | POA: Diagnosis not present

## 2020-10-27 DIAGNOSIS — Z299 Encounter for prophylactic measures, unspecified: Secondary | ICD-10-CM | POA: Diagnosis not present

## 2020-10-27 DIAGNOSIS — Z6829 Body mass index (BMI) 29.0-29.9, adult: Secondary | ICD-10-CM | POA: Diagnosis not present

## 2020-10-27 DIAGNOSIS — Z713 Dietary counseling and surveillance: Secondary | ICD-10-CM | POA: Diagnosis not present

## 2020-10-27 DIAGNOSIS — I10 Essential (primary) hypertension: Secondary | ICD-10-CM | POA: Diagnosis not present

## 2020-11-15 DIAGNOSIS — Z299 Encounter for prophylactic measures, unspecified: Secondary | ICD-10-CM | POA: Diagnosis not present

## 2020-11-15 DIAGNOSIS — Z789 Other specified health status: Secondary | ICD-10-CM | POA: Diagnosis not present

## 2020-11-15 DIAGNOSIS — I10 Essential (primary) hypertension: Secondary | ICD-10-CM | POA: Diagnosis not present

## 2020-11-15 DIAGNOSIS — N390 Urinary tract infection, site not specified: Secondary | ICD-10-CM | POA: Diagnosis not present

## 2020-11-15 DIAGNOSIS — L659 Nonscarring hair loss, unspecified: Secondary | ICD-10-CM | POA: Diagnosis not present

## 2020-11-16 ENCOUNTER — Encounter (INDEPENDENT_AMBULATORY_CARE_PROVIDER_SITE_OTHER): Payer: Self-pay | Admitting: *Deleted

## 2020-12-08 DIAGNOSIS — N390 Urinary tract infection, site not specified: Secondary | ICD-10-CM | POA: Diagnosis not present

## 2020-12-08 DIAGNOSIS — Z299 Encounter for prophylactic measures, unspecified: Secondary | ICD-10-CM | POA: Diagnosis not present

## 2020-12-08 DIAGNOSIS — I10 Essential (primary) hypertension: Secondary | ICD-10-CM | POA: Diagnosis not present

## 2020-12-08 DIAGNOSIS — R32 Unspecified urinary incontinence: Secondary | ICD-10-CM | POA: Diagnosis not present

## 2021-01-06 DIAGNOSIS — N3021 Other chronic cystitis with hematuria: Secondary | ICD-10-CM | POA: Diagnosis not present

## 2021-01-06 DIAGNOSIS — R3121 Asymptomatic microscopic hematuria: Secondary | ICD-10-CM | POA: Diagnosis not present

## 2021-01-11 DIAGNOSIS — L570 Actinic keratosis: Secondary | ICD-10-CM | POA: Diagnosis not present

## 2021-01-11 DIAGNOSIS — C182 Malignant neoplasm of ascending colon: Secondary | ICD-10-CM | POA: Diagnosis not present

## 2021-01-16 DIAGNOSIS — R300 Dysuria: Secondary | ICD-10-CM | POA: Diagnosis not present

## 2021-01-16 DIAGNOSIS — N390 Urinary tract infection, site not specified: Secondary | ICD-10-CM | POA: Diagnosis not present

## 2021-01-24 DIAGNOSIS — C189 Malignant neoplasm of colon, unspecified: Secondary | ICD-10-CM | POA: Diagnosis not present

## 2021-01-24 DIAGNOSIS — I700 Atherosclerosis of aorta: Secondary | ICD-10-CM | POA: Diagnosis not present

## 2021-01-24 DIAGNOSIS — U071 COVID-19: Secondary | ICD-10-CM | POA: Diagnosis not present

## 2021-01-24 DIAGNOSIS — F3342 Major depressive disorder, recurrent, in full remission: Secondary | ICD-10-CM | POA: Diagnosis not present

## 2021-01-24 DIAGNOSIS — Z299 Encounter for prophylactic measures, unspecified: Secondary | ICD-10-CM | POA: Diagnosis not present

## 2021-02-03 DIAGNOSIS — N952 Postmenopausal atrophic vaginitis: Secondary | ICD-10-CM | POA: Diagnosis not present

## 2021-02-03 DIAGNOSIS — N390 Urinary tract infection, site not specified: Secondary | ICD-10-CM | POA: Diagnosis not present

## 2021-02-03 DIAGNOSIS — Z299 Encounter for prophylactic measures, unspecified: Secondary | ICD-10-CM | POA: Diagnosis not present

## 2021-02-03 DIAGNOSIS — R21 Rash and other nonspecific skin eruption: Secondary | ICD-10-CM | POA: Diagnosis not present

## 2021-02-03 DIAGNOSIS — Z6829 Body mass index (BMI) 29.0-29.9, adult: Secondary | ICD-10-CM | POA: Diagnosis not present

## 2021-02-22 DIAGNOSIS — Z23 Encounter for immunization: Secondary | ICD-10-CM | POA: Diagnosis not present

## 2021-03-05 DIAGNOSIS — Z20828 Contact with and (suspected) exposure to other viral communicable diseases: Secondary | ICD-10-CM | POA: Diagnosis not present

## 2021-04-06 DIAGNOSIS — M170 Bilateral primary osteoarthritis of knee: Secondary | ICD-10-CM | POA: Diagnosis not present

## 2021-04-12 DIAGNOSIS — C182 Malignant neoplasm of ascending colon: Secondary | ICD-10-CM | POA: Diagnosis not present

## 2021-04-19 DIAGNOSIS — E611 Iron deficiency: Secondary | ICD-10-CM | POA: Diagnosis not present

## 2021-04-19 DIAGNOSIS — C182 Malignant neoplasm of ascending colon: Secondary | ICD-10-CM | POA: Diagnosis not present

## 2021-05-05 DIAGNOSIS — Z20828 Contact with and (suspected) exposure to other viral communicable diseases: Secondary | ICD-10-CM | POA: Diagnosis not present

## 2021-06-08 DIAGNOSIS — E039 Hypothyroidism, unspecified: Secondary | ICD-10-CM | POA: Diagnosis not present

## 2021-06-08 DIAGNOSIS — Z1339 Encounter for screening examination for other mental health and behavioral disorders: Secondary | ICD-10-CM | POA: Diagnosis not present

## 2021-06-08 DIAGNOSIS — Z789 Other specified health status: Secondary | ICD-10-CM | POA: Diagnosis not present

## 2021-06-08 DIAGNOSIS — Z7189 Other specified counseling: Secondary | ICD-10-CM | POA: Diagnosis not present

## 2021-06-08 DIAGNOSIS — E7800 Pure hypercholesterolemia, unspecified: Secondary | ICD-10-CM | POA: Diagnosis not present

## 2021-06-08 DIAGNOSIS — Z299 Encounter for prophylactic measures, unspecified: Secondary | ICD-10-CM | POA: Diagnosis not present

## 2021-06-08 DIAGNOSIS — R5383 Other fatigue: Secondary | ICD-10-CM | POA: Diagnosis not present

## 2021-06-08 DIAGNOSIS — Z1331 Encounter for screening for depression: Secondary | ICD-10-CM | POA: Diagnosis not present

## 2021-06-08 DIAGNOSIS — Z6830 Body mass index (BMI) 30.0-30.9, adult: Secondary | ICD-10-CM | POA: Diagnosis not present

## 2021-06-08 DIAGNOSIS — I10 Essential (primary) hypertension: Secondary | ICD-10-CM | POA: Diagnosis not present

## 2021-06-08 DIAGNOSIS — Z0000 Encounter for general adult medical examination without abnormal findings: Secondary | ICD-10-CM | POA: Diagnosis not present

## 2021-06-08 DIAGNOSIS — R739 Hyperglycemia, unspecified: Secondary | ICD-10-CM | POA: Diagnosis not present

## 2021-06-08 DIAGNOSIS — Z79899 Other long term (current) drug therapy: Secondary | ICD-10-CM | POA: Diagnosis not present

## 2021-06-23 DIAGNOSIS — Z20828 Contact with and (suspected) exposure to other viral communicable diseases: Secondary | ICD-10-CM | POA: Diagnosis not present

## 2021-06-27 DIAGNOSIS — E2839 Other primary ovarian failure: Secondary | ICD-10-CM | POA: Diagnosis not present

## 2021-06-27 DIAGNOSIS — Z79899 Other long term (current) drug therapy: Secondary | ICD-10-CM | POA: Diagnosis not present

## 2021-07-06 DIAGNOSIS — H2513 Age-related nuclear cataract, bilateral: Secondary | ICD-10-CM | POA: Diagnosis not present

## 2021-07-06 DIAGNOSIS — H5203 Hypermetropia, bilateral: Secondary | ICD-10-CM | POA: Diagnosis not present

## 2021-07-06 DIAGNOSIS — H524 Presbyopia: Secondary | ICD-10-CM | POA: Diagnosis not present

## 2021-07-06 DIAGNOSIS — H52223 Regular astigmatism, bilateral: Secondary | ICD-10-CM | POA: Diagnosis not present

## 2021-07-06 DIAGNOSIS — H1045 Other chronic allergic conjunctivitis: Secondary | ICD-10-CM | POA: Diagnosis not present

## 2021-07-26 IMAGING — CT CT CHEST-ABD-PELV W/ CM
3 of 5 series · 14 of 36 positions shown, 16 images · IV contrast (Omnipaque or Isovue)
Comparison: CT abdomen pelvis, 10/03/2019

CLINICAL DATA: Colon cancer staging, recent identification of
possible ascending colon malignancy by colonoscopy

EXAM:
CT CHEST, ABDOMEN, AND PELVIS WITH CONTRAST
TECHNIQUE: Multidetector CT imaging of the chest, abdomen and pelvis was
performed following the standard protocol during bolus
administration of intravenous contrast.
CONTRAST:  100mL OMNIPAQUE IOHEXOL 300 MG/ML SOLN, additional oral
enteric contrast

[Series 2: cap with · axial · 0.83mm/px · z∈[+1069,+1589]mm · 9 of 131 slices shown, 11 images]
[im 14/131  mediastinal]
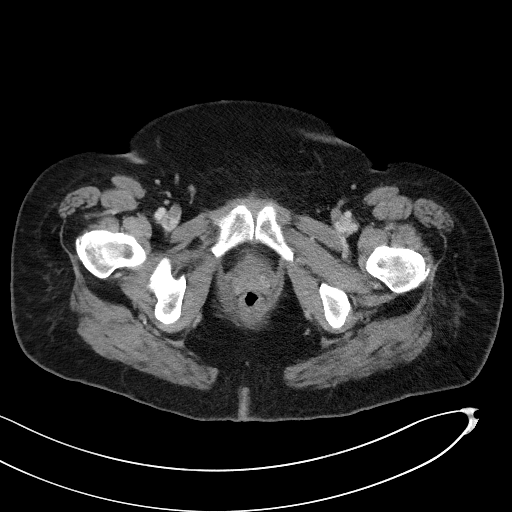
[im 14/131  bone]
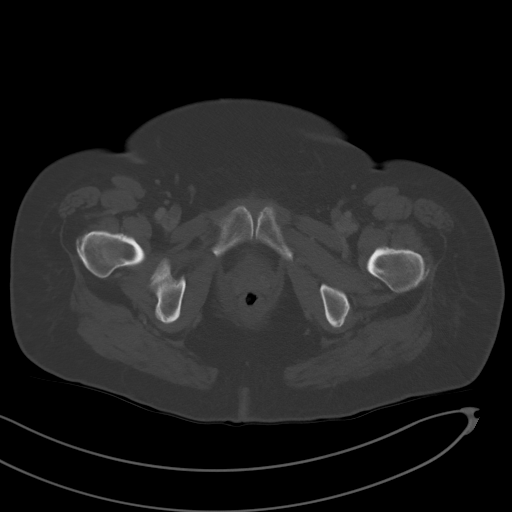
[im 27/131  mediastinal]
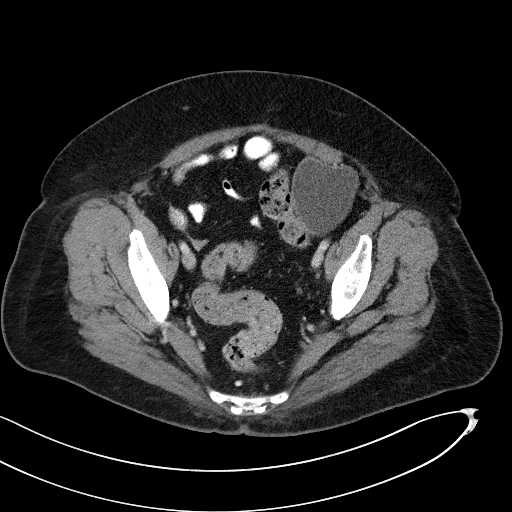
[im 40/131  mediastinal]
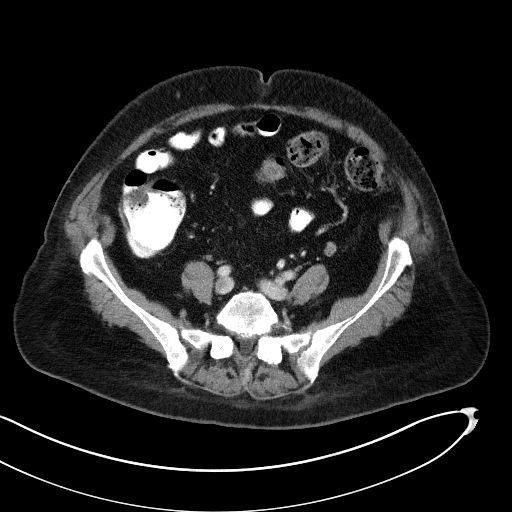
[im 53/131  mediastinal]
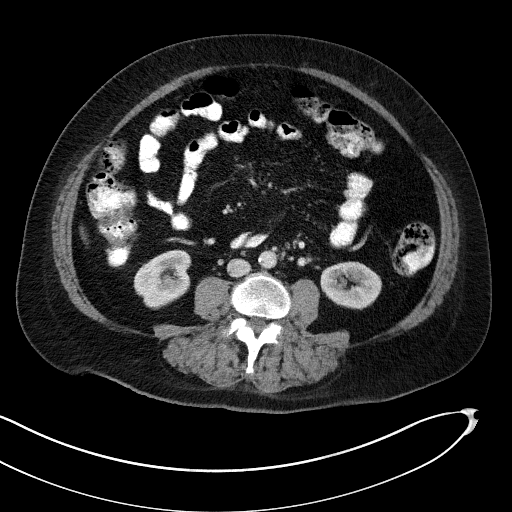
[im 66/131  mediastinal]
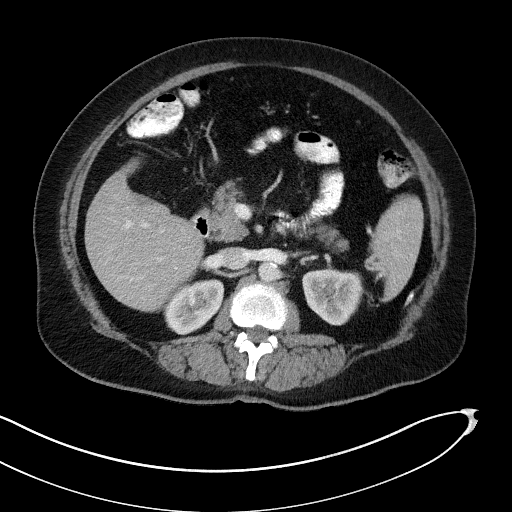
[im 79/131  mediastinal]
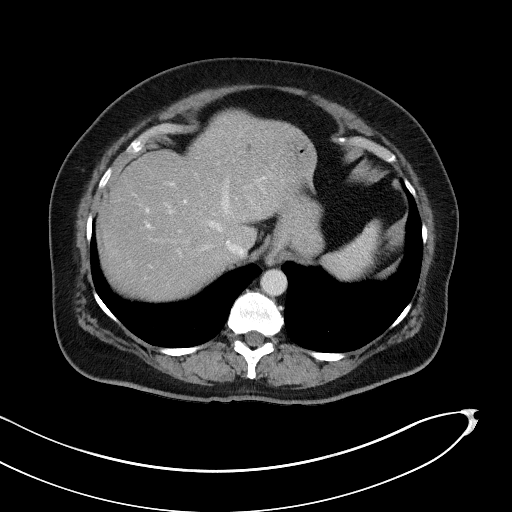
[im 92/131  mediastinal]
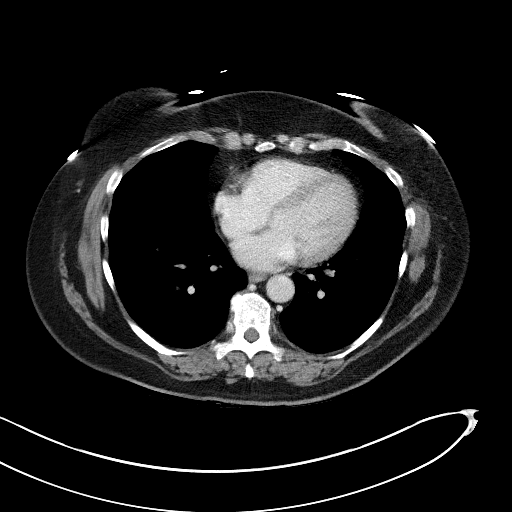
[im 105/131  mediastinal]
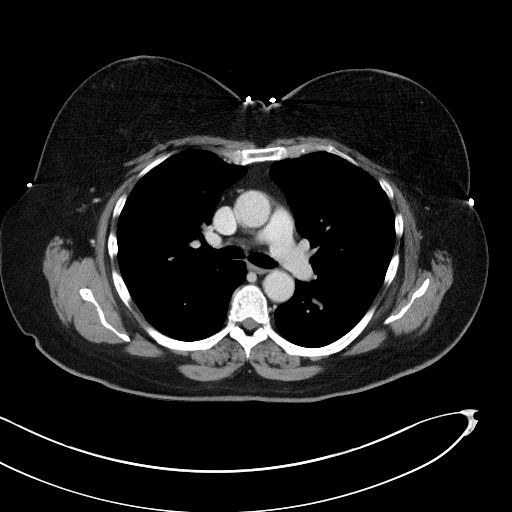
[im 118/131  mediastinal]
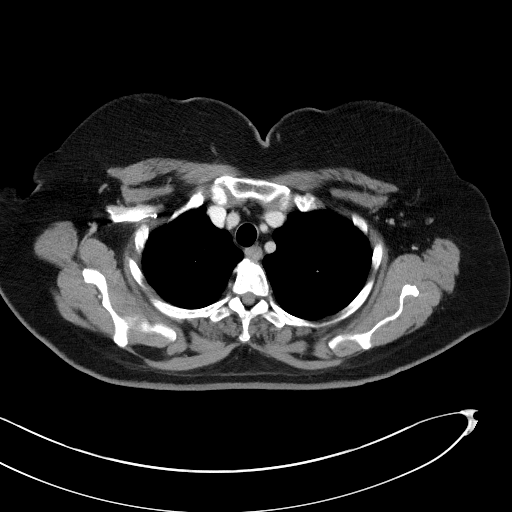
[im 118/131  bone]
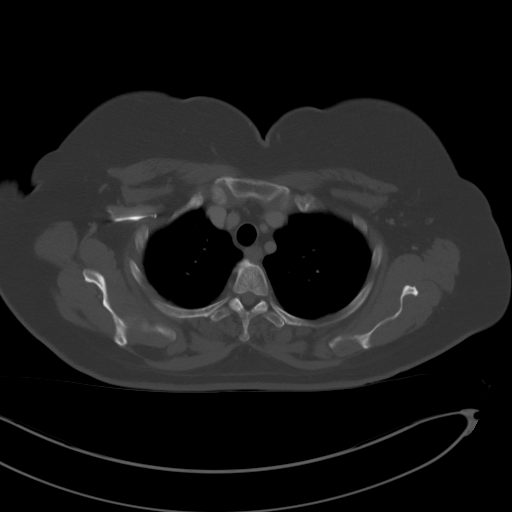

[Series 3: lung · axial · 0.83mm/px · z∈[+1358,+1406]mm · 2 of 161 slices shown]
[im 13/161  bone]
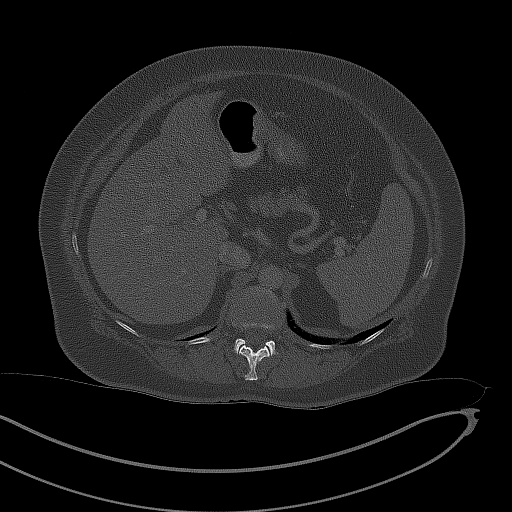
[im 37/161  bone]
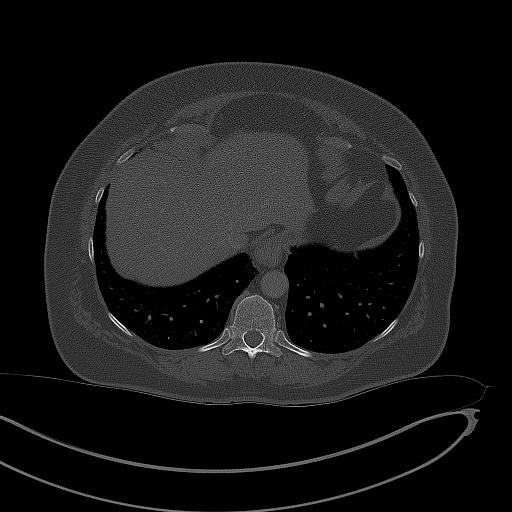

[Series 4: coronals · coronal · 0.86mm/px · 3 of 183 slices shown]
[im 37/183  mediastinal]
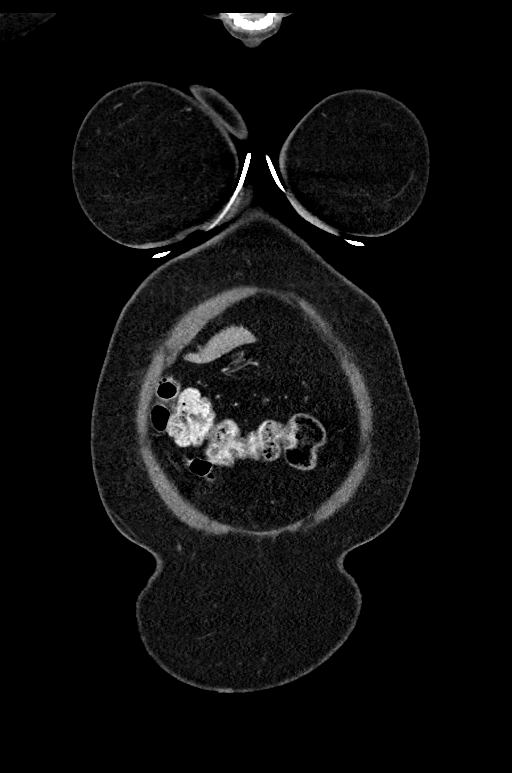
[im 73/183  mediastinal]
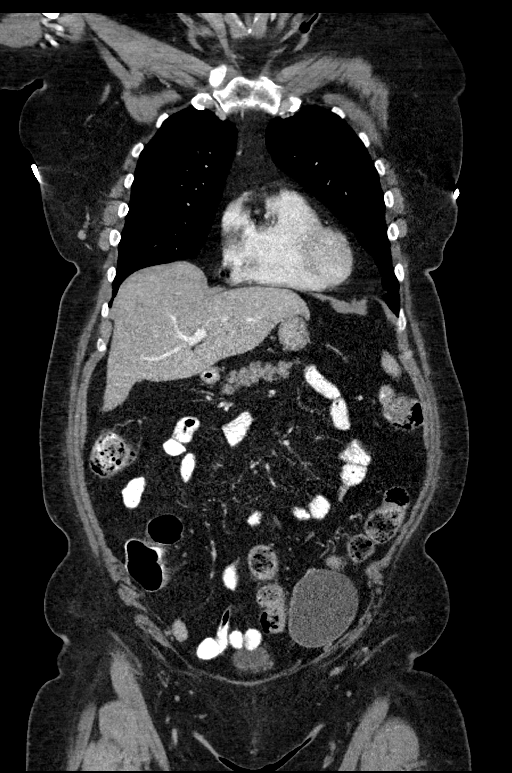
[im 110/183  mediastinal]
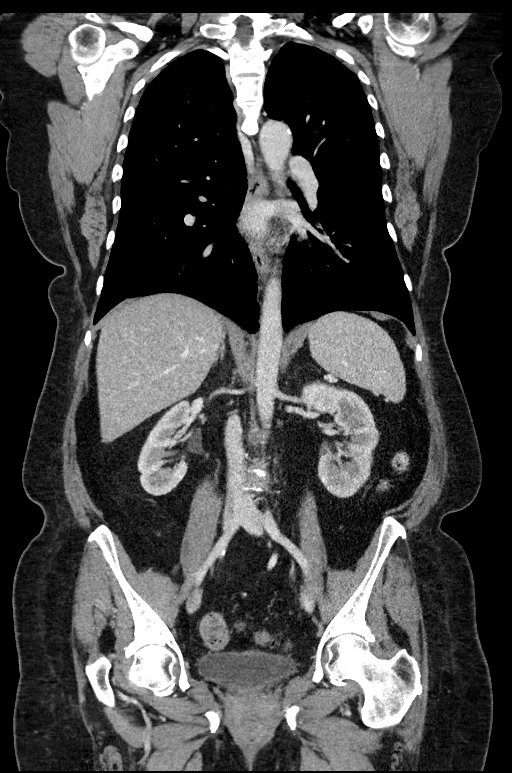

[14 of 36 positions shown; findings below may reference images not displayed]

FINDINGS: CT CHEST FINDINGS

Cardiovascular: Aortic atherosclerosis. Normal heart size. Scattered
coronary artery calcifications. No pericardial effusion.

Mediastinum/Nodes: No enlarged mediastinal, hilar, or axillary lymph
nodes. Thyroid gland, trachea, and esophagus demonstrate no
significant findings.

Lungs/Pleura: Lungs are clear. No pleural effusion or pneumothorax.

Musculoskeletal: No chest wall mass or suspicious bone lesions
identified.

CT ABDOMEN PELVIS FINDINGS

Hepatobiliary: No focal liver abnormality is seen. Status post
cholecystectomy. No biliary dilatation.

Pancreas: Unremarkable. No pancreatic ductal dilatation or
surrounding inflammatory changes.

Spleen: Normal in size without significant abnormality.

Adrenals/Urinary Tract: Adrenal glands are unremarkable. Kidneys are
normal, without renal calculi, solid lesion, or hydronephrosis.
Bladder is unremarkable.

Stomach/Bowel: Stomach is within normal limits. Appendix is not
clearly visualized and may be surgically absent. There is a
partially circumferential thickening and narrowing of the inferior
ascending colon, just distal to the ileocecal valve (series 2, image
86, series 4, image 95). No evidence of bowel wall thickening,
distention, or inflammatory changes.

Vascular/Lymphatic: Aortic atherosclerosis. No enlarged abdominal or
pelvic lymph nodes.

Reproductive: Status post hysterectomy. Redemonstrated fluid
attenuation left ovarian cystic lesion measuring 6.9 x 5.7 cm, not
significantly changed compared to prior examination (series 2, image
102). Redemonstrated fluid attenuation cystic lesion in the
posterior right hemipelvis adjacent to the vaginal cuff, measuring
4.4 x 3.4 cm, not significantly changed compared to prior
examination (series 2, image 110).

Other: No abdominal wall hernia or abnormality. No abdominopelvic
ascites.

Musculoskeletal: No acute or significant osseous findings.
IMPRESSION: 1. There is a partially circumferential thickening and narrowing of
the inferior ascending colon, just distal to the ileocecal valve,
presumably a correlate for abnormality identified by recent
colonoscopy and concerning for primary colon malignancy.
2. No evidence of lymphadenopathy or metastatic disease in the
chest, abdomen, or pelvis.
3. Redemonstrated fluid attenuation left ovarian cystic lesion
measuring 6.9 x 5.7 cm, not significantly changed compared to prior
examination. Given size ultrasound is again recommended for further
characterization. Note: This recommendation does not apply to
premenarchal patients and to those with increased risk (genetic,
family history, elevated tumor markers or other high-risk factors)
of ovarian cancer. Reference: JACR [DATE]):[id number]
4. Redemonstrated fluid attenuation cystic lesion in the posterior
right hemipelvis adjacent to the vaginal cuff, measuring 4.4 x
cm, not significantly changed compared to prior examination. This is
clearly not of ovarian origin given location relative to the ovarian
vessels, almost certainly a postoperative lymphocele or seroma
following hysterectomy. This could be further characterize by above
recommended ultrasound.
5. Status post cholecystectomy and hysterectomy.
6. Coronary artery disease.

Aortic Atherosclerosis ([id number].V).

## 2021-08-16 DIAGNOSIS — K769 Liver disease, unspecified: Secondary | ICD-10-CM | POA: Diagnosis not present

## 2021-08-16 DIAGNOSIS — R918 Other nonspecific abnormal finding of lung field: Secondary | ICD-10-CM | POA: Diagnosis not present

## 2021-08-16 DIAGNOSIS — Z90710 Acquired absence of both cervix and uterus: Secondary | ICD-10-CM | POA: Diagnosis not present

## 2021-08-16 DIAGNOSIS — Z9049 Acquired absence of other specified parts of digestive tract: Secondary | ICD-10-CM | POA: Diagnosis not present

## 2021-08-16 DIAGNOSIS — C182 Malignant neoplasm of ascending colon: Secondary | ICD-10-CM | POA: Diagnosis not present

## 2021-08-19 DIAGNOSIS — Z1231 Encounter for screening mammogram for malignant neoplasm of breast: Secondary | ICD-10-CM | POA: Diagnosis not present

## 2021-08-20 DIAGNOSIS — Z20828 Contact with and (suspected) exposure to other viral communicable diseases: Secondary | ICD-10-CM | POA: Diagnosis not present

## 2021-08-23 DIAGNOSIS — C182 Malignant neoplasm of ascending colon: Secondary | ICD-10-CM | POA: Diagnosis not present

## 2021-08-25 ENCOUNTER — Encounter (INDEPENDENT_AMBULATORY_CARE_PROVIDER_SITE_OTHER): Payer: Self-pay | Admitting: *Deleted

## 2021-08-30 DIAGNOSIS — N951 Menopausal and female climacteric states: Secondary | ICD-10-CM | POA: Diagnosis not present

## 2021-08-30 DIAGNOSIS — Z79899 Other long term (current) drug therapy: Secondary | ICD-10-CM | POA: Diagnosis not present

## 2021-08-31 DIAGNOSIS — M25562 Pain in left knee: Secondary | ICD-10-CM | POA: Diagnosis not present

## 2021-08-31 DIAGNOSIS — M25561 Pain in right knee: Secondary | ICD-10-CM | POA: Diagnosis not present

## 2021-09-14 DIAGNOSIS — Z299 Encounter for prophylactic measures, unspecified: Secondary | ICD-10-CM | POA: Diagnosis not present

## 2021-09-14 DIAGNOSIS — J45909 Unspecified asthma, uncomplicated: Secondary | ICD-10-CM | POA: Diagnosis not present

## 2021-09-14 DIAGNOSIS — I10 Essential (primary) hypertension: Secondary | ICD-10-CM | POA: Diagnosis not present

## 2021-09-20 DIAGNOSIS — Z299 Encounter for prophylactic measures, unspecified: Secondary | ICD-10-CM | POA: Diagnosis not present

## 2021-09-20 DIAGNOSIS — I10 Essential (primary) hypertension: Secondary | ICD-10-CM | POA: Diagnosis not present

## 2021-09-20 DIAGNOSIS — R059 Cough, unspecified: Secondary | ICD-10-CM | POA: Diagnosis not present

## 2021-09-20 DIAGNOSIS — Z789 Other specified health status: Secondary | ICD-10-CM | POA: Diagnosis not present

## 2021-09-20 DIAGNOSIS — J45909 Unspecified asthma, uncomplicated: Secondary | ICD-10-CM | POA: Diagnosis not present

## 2021-09-22 DIAGNOSIS — Z789 Other specified health status: Secondary | ICD-10-CM | POA: Diagnosis not present

## 2021-09-22 DIAGNOSIS — J45909 Unspecified asthma, uncomplicated: Secondary | ICD-10-CM | POA: Diagnosis not present

## 2021-09-22 DIAGNOSIS — I10 Essential (primary) hypertension: Secondary | ICD-10-CM | POA: Diagnosis not present

## 2021-09-22 DIAGNOSIS — Z299 Encounter for prophylactic measures, unspecified: Secondary | ICD-10-CM | POA: Diagnosis not present

## 2021-10-11 ENCOUNTER — Other Ambulatory Visit (INDEPENDENT_AMBULATORY_CARE_PROVIDER_SITE_OTHER): Payer: Self-pay

## 2021-10-11 ENCOUNTER — Encounter (INDEPENDENT_AMBULATORY_CARE_PROVIDER_SITE_OTHER): Payer: Self-pay | Admitting: *Deleted

## 2021-10-11 DIAGNOSIS — K219 Gastro-esophageal reflux disease without esophagitis: Secondary | ICD-10-CM | POA: Diagnosis not present

## 2021-10-11 DIAGNOSIS — I10 Essential (primary) hypertension: Secondary | ICD-10-CM | POA: Diagnosis not present

## 2021-10-11 DIAGNOSIS — C182 Malignant neoplasm of ascending colon: Secondary | ICD-10-CM

## 2021-10-11 DIAGNOSIS — Z299 Encounter for prophylactic measures, unspecified: Secondary | ICD-10-CM | POA: Diagnosis not present

## 2021-10-11 DIAGNOSIS — R059 Cough, unspecified: Secondary | ICD-10-CM | POA: Diagnosis not present

## 2021-10-14 DIAGNOSIS — E785 Hyperlipidemia, unspecified: Secondary | ICD-10-CM | POA: Diagnosis not present

## 2021-10-14 DIAGNOSIS — J984 Other disorders of lung: Secondary | ICD-10-CM | POA: Diagnosis not present

## 2021-10-14 DIAGNOSIS — J209 Acute bronchitis, unspecified: Secondary | ICD-10-CM | POA: Diagnosis not present

## 2021-10-14 DIAGNOSIS — R0602 Shortness of breath: Secondary | ICD-10-CM | POA: Diagnosis not present

## 2021-10-14 DIAGNOSIS — R609 Edema, unspecified: Secondary | ICD-10-CM | POA: Diagnosis not present

## 2021-10-14 DIAGNOSIS — Z20822 Contact with and (suspected) exposure to covid-19: Secondary | ICD-10-CM | POA: Diagnosis not present

## 2021-10-14 DIAGNOSIS — R059 Cough, unspecified: Secondary | ICD-10-CM | POA: Diagnosis not present

## 2021-10-17 DIAGNOSIS — Z299 Encounter for prophylactic measures, unspecified: Secondary | ICD-10-CM | POA: Diagnosis not present

## 2021-10-17 DIAGNOSIS — R059 Cough, unspecified: Secondary | ICD-10-CM | POA: Diagnosis not present

## 2021-10-17 DIAGNOSIS — J189 Pneumonia, unspecified organism: Secondary | ICD-10-CM | POA: Diagnosis not present

## 2021-10-17 DIAGNOSIS — I10 Essential (primary) hypertension: Secondary | ICD-10-CM | POA: Diagnosis not present

## 2021-11-01 ENCOUNTER — Encounter (INDEPENDENT_AMBULATORY_CARE_PROVIDER_SITE_OTHER): Payer: Self-pay

## 2021-11-01 ENCOUNTER — Telehealth (INDEPENDENT_AMBULATORY_CARE_PROVIDER_SITE_OTHER): Payer: Self-pay

## 2021-11-01 ENCOUNTER — Encounter: Payer: Self-pay | Admitting: Internal Medicine

## 2021-11-01 ENCOUNTER — Ambulatory Visit (INDEPENDENT_AMBULATORY_CARE_PROVIDER_SITE_OTHER): Payer: Medicare Other | Admitting: Internal Medicine

## 2021-11-01 DIAGNOSIS — R0609 Other forms of dyspnea: Secondary | ICD-10-CM

## 2021-11-01 DIAGNOSIS — R058 Other specified cough: Secondary | ICD-10-CM

## 2021-11-01 LAB — CBC WITH DIFFERENTIAL/PLATELET
Basophils Absolute: 0.1 10*3/uL (ref 0.0–0.1)
Basophils Relative: 0.9 % (ref 0.0–3.0)
Eosinophils Absolute: 0.3 10*3/uL (ref 0.0–0.7)
Eosinophils Relative: 2.5 % (ref 0.0–5.0)
HCT: 38 % (ref 36.0–46.0)
Hemoglobin: 12.9 g/dL (ref 12.0–15.0)
Lymphocytes Relative: 16.6 % (ref 12.0–46.0)
Lymphs Abs: 1.9 10*3/uL (ref 0.7–4.0)
MCHC: 33.9 g/dL (ref 30.0–36.0)
MCV: 87.5 fl (ref 78.0–100.0)
Monocytes Absolute: 1 10*3/uL (ref 0.1–1.0)
Monocytes Relative: 8.4 % (ref 3.0–12.0)
Neutro Abs: 8.3 10*3/uL — ABNORMAL HIGH (ref 1.4–7.7)
Neutrophils Relative %: 71.6 % (ref 43.0–77.0)
Platelets: 387 10*3/uL (ref 150.0–400.0)
RBC: 4.35 Mil/uL (ref 3.87–5.11)
RDW: 14.1 % (ref 11.5–15.5)
WBC: 11.6 10*3/uL — ABNORMAL HIGH (ref 4.0–10.5)

## 2021-11-01 LAB — BASIC METABOLIC PANEL
BUN: 14 mg/dL (ref 6–23)
CO2: 28 mEq/L (ref 19–32)
Calcium: 9.7 mg/dL (ref 8.4–10.5)
Chloride: 102 mEq/L (ref 96–112)
Creatinine, Ser: 0.79 mg/dL (ref 0.40–1.20)
GFR: 74.23 mL/min (ref >60.00)
Glucose, Bld: 106 mg/dL — ABNORMAL HIGH (ref 70–99)
Potassium: 4.4 mEq/L (ref 3.5–5.1)
Sodium: 137 mEq/L (ref 135–145)

## 2021-11-01 LAB — SEDIMENTATION RATE: Sed Rate: 55 mm/hr — ABNORMAL HIGH (ref 0–30)

## 2021-11-01 LAB — BRAIN NATRIURETIC PEPTIDE: Pro B Natriuretic peptide (BNP): 25 pg/mL (ref 0.0–100.0)

## 2021-11-01 MED ORDER — NA SULFATE-K SULFATE-MG SULF 17.5-3.13-1.6 GM/177ML PO SOLN: 1.0000 | ORAL | 0 refills | Status: DC

## 2021-11-01 NOTE — Telephone Encounter (Signed)
Ok to schedule.  Thanks,  Om Lizotte Castaneda Mayorga, MD Gastroenterology and Hepatology [facility]  

## 2021-11-01 NOTE — Telephone Encounter (Signed)
Urho Rio Ann Dajanae Brophy, CMA  ?

## 2021-11-01 NOTE — Telephone Encounter (Signed)
Referring MD/PCP: Vyas  Procedure: tcs  Reason/Indication:  hx of colon ca  Has patient had this procedure before?  Yes   If so, when, by whom and where?  Feb 2022  Is there a family history of colon cancer?  yes  Who?  What age when diagnosed?    Is patient diabetic? If yes, Type 1 or Type 2   no      Does patient have prosthetic heart valve or mechanical valve?  no  Do you have a pacemaker/defibrillator?  no  Has patient ever had endocarditis/atrial fibrillation? no  Does patient use oxygen? no  Has patient had joint replacement within last 12 months?  no  Is patient constipated or do they take laxatives? no  Does patient have a history of alcohol/drug use?  no  Have you had a stroke/heart attack last 6 mths? no  Do you take medicine for weight loss?  no  For female patients,: have you had a hysterectomy yes                      are you post menopausal yes                      do you still have your menstrual cycle no   Is patient on blood thinner such as Coumadin, Plavix and/or Aspirin? No   Medications: cranberry caplets 60 mg bid, loratadine 10 mg daily, protonix 40 mg daily, zoloft 100 mg daily, wellbutrin 150 mg daily, singulair 10 mg daily, lipitor 10 mg daily, elavil 24 mg daily, levothyroxin 100 mg daily, biotin 5000 mg daily   Allergies: amoxicillin   Medication Adjustment per Dr Jenetta Downer none  Procedure date & time: 11/29/21 at  915

## 2021-11-01 NOTE — Patient Instructions (Addendum)
GERD (REFLUX)  is an extremely common cause of respiratory symptoms just like yours , many times with no obvious heartburn at all.    It can be treated with medication, but also with lifestyle changes including elevation of the head of your bed (ideally with 6 -8inch blocks under the headboard of your bed),  Smoking cessation, avoidance of late meals, excessive alcohol, and avoid fatty foods, chocolate, peppermint, colas, red wine, and acidic juices such as orange juice.  NO MINT OR MENTHOL PRODUCTS SO NO COUGH DROPS  USE SUGARLESS CANDY INSTEAD (Jolley ranchers or Stover's or Life Savers) or even ice chips will also do - the key is to swallow to prevent all throat clearing. NO OIL BASED VITAMINS - use powdered substitutes.  Avoid fish oil when coughing.    Pantoprazole (protonix) 40 mg   Take  30-60 min before first meal of the day and Pepcid (famotidine)  20 mg after supper until return to office - this is the best way to tell whether stomach acid is contributing to your problem.     Please remember to go to the lab department   for your tests - we will call you with the results when they are available.      Please schedule a follow up office visit in  3-4  weeks, sooner if needed

## 2021-11-01 NOTE — Progress Notes (Unsigned)
   Bianca Vasquez, female    DOB: Feb 01, 1951   MRN: [id number]   Brief patient profile:  28  yowf quit smoking 1989 referred to pulmonary clinic 11/01/2021 by Bianca Gammon NP  for cough p Covid Dec 2022  s/p vax x 2 prior       allergies as child sinus problem as adult allergy shots  in her 75s and sniffed every since     History of Present Illness  11/01/2021  Pulmonary/ 1st office eval/Bianca Vasquez onset  abrupt onset May 2023  with viral uri fever chills no obvious  Chief Complaint  Patient presents with   Consult    Pt states she was sent here by Dr Bianca Vasquez. Pt states she has been sick for 2 months with a cough and sore chest.  Dyspnea:  back to baseline  Cough: improved since onset 90%> no excess mucus  Sleep: fine flat now no resp cc  SABA use: nebulizer makes her cough  - last used 8 pm  Still has same sniffles  Last abx for recurrent uti last Dec 2022  Energy level not better    Past Medical History:  Diagnosis Date   Anemia    Depression    GERD (gastroesophageal reflux disease)    Hypothyroidism     Outpatient Medications Prior to Visit  Medication Sig Dispense Refill   amitriptyline (ELAVIL) 25 MG tablet Take 25 mg by mouth daily.     Biotin 1000 MCG tablet Take 1,000 mcg by mouth daily.     buPROPion (ZYBAN) 150 MG 12 hr tablet Take 150 mg by mouth daily.     estradiol (ESTRACE) 1 MG tablet Take 1 mg by mouth daily.     levothyroxine (SYNTHROID) 125 MCG tablet Take 125 mcg by mouth daily before breakfast.     loratadine (CLARITIN) 10 MG tablet Take 10 mg by mouth daily.     montelukast (SINGULAIR) 10 MG tablet Take 10 mg by mouth at bedtime.     pantoprazole (PROTONIX) 40 MG tablet Take 40 mg by mouth daily.     sertraline (ZOLOFT) 100 MG tablet Take 100 mg by mouth daily.     simvastatin (ZOCOR) 40 MG tablet Take 20 mg by mouth daily.     albuterol (ACCUNEB) 1.25 MG/3ML nebulizer solution SMARTSIG:3 Milliliter(s) Via Nebulizer 4 Times Daily PRN     Na  Sulfate-K Sulfate-Mg Sulf (SUPREP BOWEL PREP KIT) 17.5-3.13-1.6 GM/177ML SOLN Take 1 kit by mouth as directed. (Patient not taking: Reported on 11/01/2021) 354 mL 0   No facility-administered medications prior to visit.     Objective:     BP 130/68 (BP Location: Left Arm, Patient Position: Sitting, Cuff Size: Large)   Pulse 86   Temp 98.2 F (36.8 C) (Oral)   Ht $R'5\' 5"'Yj$  (1.651 m)   Wt 184 lb 6.4 oz (83.6 kg)   SpO2 96% Comment: on RA  BMI 30.69 kg/m   SpO2: 96 % (on RA)  Amb wf nad / chewing gum freq throat clearing          Assessment   No problem-specific Assessment & Plan notes found for this encounter.     Bianca Gully, MD 11/01/2021

## 2021-11-02 ENCOUNTER — Telehealth: Payer: Self-pay | Admitting: Internal Medicine

## 2021-11-02 ENCOUNTER — Encounter: Payer: Self-pay | Admitting: Internal Medicine

## 2021-11-02 DIAGNOSIS — R058 Other specified cough: Secondary | ICD-10-CM | POA: Insufficient documentation

## 2021-11-02 LAB — IGE: IgE (Immunoglobulin E), Serum: 41 kU/L (ref <=114)

## 2021-11-02 MED ORDER — PANTOPRAZOLE SODIUM 40 MG PO TBEC: 40.0000 mg | DELAYED_RELEASE_TABLET | Freq: Every day | ORAL | 1 refills | Status: DC

## 2021-11-02 NOTE — Assessment & Plan Note (Signed)
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

## 2021-11-02 NOTE — Assessment & Plan Note (Addendum)
Allergies as child = "sinus problem" and  as adult allergy shots  in her 16s but "sniffed every since"  - flared with "viral URI"  May 2023 - rec max gerd rx  11/01/2021  - Allergy screen 11/01/2021 >  Eos 0.3 /  IgE  Pending   Upper airway cough syndrome (previously labeled PNDS),  is so named because it's frequently impossible to sort out how much is  CR/sinusitis with freq throat clearing (which can be related to primary GERD)   vs  causing  secondary (" extra esophageal")  GERD from wide swings in gastric pressure that occur with throat clearing, often  promoting self use of mint and menthol lozenges that reduce the lower esophageal sphincter tone and exacerbate the problem further in a cyclical fashion.   These are the same pts (now being labeled as having "irritable larynx syndrome" by some cough centers) who not infrequently have a history of having failed to tolerate ace inhibitors,  dry powder inhalers or biphosphonates or report having atypical/extraesophageal reflux symptoms that don't respond to standard doses of PPI  and are easily confused as having aecopd or asthma flares by even experienced allergists/ pulmonologists (myself included).    rec Max rx for gerd / use non mint/menthol hard rock candies to suppress urge to clear throat/ consider allergy re-eval pending IgE levels as above      Medical decision making was a moderate level of complexity in this case because of  two chronic conditions /diagnoses requiring extra time for  H and P, chart review, counseling,  observation of walking saturation studay and generating customized AVS unique to this new pt office visit and charting.   Each maintenance medication was reviewed in detail including emphasizing most importantly the difference between maintenance and prns and under what circumstances the prns are to be triggered using an action plan format where appropriate. Please see avs for details which were reviewed in writing by both  me and my nurse and patient given a written copy highlighted where appropriate with yellow highlighter for the patient's continued care at home along with an updated version of their medications.  Patient was asked to maintain medication reconciliation by comparing this list to the actual medications being used at home and to contact this office right away if there is a conflict or discrepancy.

## 2021-11-02 NOTE — Telephone Encounter (Signed)
I left a message per DPR that the stomach medication was sent to the preferred pharmacy and she was to call back questions. Nothing further needed.

## 2021-11-04 NOTE — Progress Notes (Signed)
Called pt and there was no answer-LMTCB °

## 2021-11-04 NOTE — Progress Notes (Signed)
Spoke with pt and notified of results per Dr. Wert. Pt verbalized understanding and denied any questions. 

## 2021-11-25 ENCOUNTER — Encounter (HOSPITAL_COMMUNITY)
Admission: RE | Admit: 2021-11-25 | Discharge: 2021-11-25 | Disposition: A | Discharge status: Home / Self Care | Payer: Medicare Other | Source: Ambulatory Visit | Attending: Gastroenterology | Admitting: Gastroenterology

## 2021-11-25 ENCOUNTER — Other Ambulatory Visit: Payer: Self-pay

## 2021-11-25 ENCOUNTER — Encounter (HOSPITAL_COMMUNITY): Payer: Self-pay

## 2021-11-29 ENCOUNTER — Encounter (HOSPITAL_COMMUNITY)
Admission: RE | Disposition: A | Discharge status: Home / Self Care | Payer: Self-pay | Source: Home / Self Care | Attending: Gastroenterology

## 2021-11-29 ENCOUNTER — Ambulatory Visit (HOSPITAL_COMMUNITY): Payer: Medicare Other | Admitting: Anesthesiology

## 2021-11-29 ENCOUNTER — Ambulatory Visit (HOSPITAL_COMMUNITY)
Admission: RE | Admit: 2021-11-29 | Discharge: 2021-11-29 | Disposition: A | Discharge status: Home / Self Care | Payer: Medicare Other | Attending: Gastroenterology | Admitting: Gastroenterology

## 2021-11-29 ENCOUNTER — Ambulatory Visit (HOSPITAL_BASED_OUTPATIENT_CLINIC_OR_DEPARTMENT_OTHER): Payer: Medicare Other | Admitting: Anesthesiology

## 2021-11-29 ENCOUNTER — Encounter (HOSPITAL_COMMUNITY): Payer: Self-pay | Admitting: Gastroenterology

## 2021-11-29 ENCOUNTER — Encounter (INDEPENDENT_AMBULATORY_CARE_PROVIDER_SITE_OTHER): Payer: Self-pay | Admitting: *Deleted

## 2021-11-29 DIAGNOSIS — Z87891 Personal history of nicotine dependence: Secondary | ICD-10-CM | POA: Insufficient documentation

## 2021-11-29 DIAGNOSIS — Z08 Encounter for follow-up examination after completed treatment for malignant neoplasm: Secondary | ICD-10-CM | POA: Diagnosis not present

## 2021-11-29 DIAGNOSIS — Z9049 Acquired absence of other specified parts of digestive tract: Secondary | ICD-10-CM | POA: Insufficient documentation

## 2021-11-29 DIAGNOSIS — Z980 Intestinal bypass and anastomosis status: Secondary | ICD-10-CM | POA: Insufficient documentation

## 2021-11-29 DIAGNOSIS — Z85038 Personal history of other malignant neoplasm of large intestine: Secondary | ICD-10-CM

## 2021-11-29 DIAGNOSIS — F32A Depression, unspecified: Secondary | ICD-10-CM | POA: Diagnosis not present

## 2021-11-29 DIAGNOSIS — Z1211 Encounter for screening for malignant neoplasm of colon: Secondary | ICD-10-CM | POA: Diagnosis not present

## 2021-11-29 DIAGNOSIS — E039 Hypothyroidism, unspecified: Secondary | ICD-10-CM | POA: Insufficient documentation

## 2021-11-29 DIAGNOSIS — D509 Iron deficiency anemia, unspecified: Secondary | ICD-10-CM

## 2021-11-29 DIAGNOSIS — K219 Gastro-esophageal reflux disease without esophagitis: Secondary | ICD-10-CM | POA: Diagnosis not present

## 2021-11-29 HISTORY — DX: Malignant (primary) neoplasm, unspecified: C80.1

## 2021-11-29 HISTORY — PX: COLONOSCOPY WITH PROPOFOL: SHX5780

## 2021-11-29 LAB — HM COLONOSCOPY

## 2021-11-29 SURGERY — COLONOSCOPY WITH PROPOFOL
Anesthesia: General

## 2021-11-29 MED ORDER — STERILE WATER FOR IRRIGATION IR SOLN
Status: DC | PRN
  Administered 2021-11-29: .6 mL

## 2021-11-29 MED ORDER — LIDOCAINE HCL (CARDIAC) PF 100 MG/5ML IV SOSY
PREFILLED_SYRINGE | INTRAVENOUS | Status: DC | PRN
  Administered 2021-11-29: 50 mg via INTRAVENOUS

## 2021-11-29 MED ORDER — PROPOFOL 500 MG/50ML IV EMUL
INTRAVENOUS | Status: DC | PRN
  Administered 2021-11-29: 175 ug/kg/min via INTRAVENOUS

## 2021-11-29 MED ORDER — PROPOFOL 10 MG/ML IV BOLUS
INTRAVENOUS | Status: DC | PRN
  Administered 2021-11-29: 30 mg via INTRAVENOUS

## 2021-11-29 MED ORDER — LACTATED RINGERS IV SOLN
INTRAVENOUS | Status: DC
Start: 2021-11-29 — End: 2021-11-29
  Administered 2021-11-29: 1000 mL via INTRAVENOUS

## 2021-11-29 NOTE — Op Note (Signed)
[facility] Patient Name: Bianca Vasquez Procedure Date: 11/29/2021 9:08 AM MRN: [id number] Date of Birth: 05/30/1950 Attending MD: [name] ,  CSN: [id number] Age: 71 Admit Type: Outpatient Procedure:                Colonoscopy Indications:              High risk colon cancer surveillance: Personal                            history of colon cancer Providers:                [name], Janeece Riggers, RN, Raphael Gibney,                            Technician Referring MD:              Medicines:                Monitored Anesthesia Care Complications:            No immediate complications. Estimated Blood Loss:     Estimated blood loss: none. Procedure:                Pre-Anesthesia Assessment:                           - Prior to the procedure, a History and Physical                            was performed, and patient medications, allergies                            and sensitivities were reviewed. The patient's                            tolerance of previous anesthesia was reviewed.                           - The risks and benefits of the procedure and the                            sedation options and risks were discussed with the                            patient. All questions were answered and informed                            consent was obtained.                           - ASA Grade Assessment: II - A patient with mild                            systemic disease.                           After obtaining informed consent, the colonoscope  was passed under direct vision. Throughout the                            procedure, the patient's blood pressure, pulse, and                            oxygen saturations were monitored continuously. The                            [id number]-[id number] ([id number]) scope was introduced through                            the anus and advanced to the the ileocolonic                            anastomosis.  The colonoscopy was performed without                            difficulty. The patient tolerated the procedure                            well. The quality of the bowel preparation was good. Scope In: 9:19:14 AM Scope Out: 9:34:19 AM Scope Withdrawal Time: 0 hours 10 minutes 26 seconds  Total Procedure Duration: 0 hours 15 minutes 5 seconds  Findings:      The perianal and digital rectal examinations were normal.      The neo-terminal ileum appeared normal.      There was evidence of a prior end-to-side ileo-colonic anastomosis in       the ascending colon. This was patent and was characterized by healthy       appearing mucosa. The anastomosis was traversed.      The retroflexed view of the distal rectum and anal verge was normal and       showed no anal or rectal abnormalities. Impression:               - The examined portion of the ileum was normal.                           - Patent end-to-side ileo-colonic anastomosis,                            characterized by healthy appearing mucosa.                           - The distal rectum and anal verge are normal on                            retroflexion view.                           - No specimens collected. Moderate Sedation:      Per Anesthesia Care Recommendation:           - Discharge patient to home (ambulatory).                           -  Resume previous diet.                           - Repeat colonoscopy in 3 years for surveillance. Procedure Code(s):        --- Professional ---                           H4388, Colorectal cancer screening; colonoscopy on                            individual at high risk Diagnosis Code(s):        --- Professional ---                           [phone], Personal history of other malignant                            neoplasm of large intestine                           Z98.0, Intestinal bypass and anastomosis status CPT copyright 2019 American Medical Association. All rights  reserved. The codes documented in this report are preliminary and upon coder review may  be revised to meet current compliance requirements. [name], MD [name],  11/29/2021 9:40:51 AM This report has been signed electronically. Number of Addenda: 0

## 2021-11-29 NOTE — Discharge Instructions (Signed)
You are being discharged home Resume your previous diet Your physician has recommended a repeat colonoscopy in 3 years

## 2021-11-29 NOTE — Anesthesia Preprocedure Evaluation (Signed)
Anesthesia Evaluation  Patient identified by MRN, date of birth, ID band Patient awake    Reviewed: Allergy & Precautions, NPO status , Patient's Chart, lab work & pertinent test results  Airway Mallampati: III  TM Distance: >3 FB Neck ROM: Full    Dental  (+) Dental Advisory Given, Teeth Intact   Pulmonary neg pulmonary ROS, former smoker,    Pulmonary exam normal breath sounds clear to auscultation       Cardiovascular + DOE  Normal cardiovascular exam Rhythm:Regular Rate:Normal     Neuro/Psych PSYCHIATRIC DISORDERS Depression negative neurological ROS     GI/Hepatic Neg liver ROS, GERD  Medicated,Colon cancer   Endo/Other  Hypothyroidism   Renal/GU negative Renal ROS  negative genitourinary   Musculoskeletal negative musculoskeletal ROS (+)   Abdominal   Peds negative pediatric ROS (+)  Hematology  (+) Blood dyscrasia, anemia ,   Anesthesia Other Findings   Reproductive/Obstetrics negative OB ROS                             Anesthesia Physical Anesthesia Plan  ASA: 2  Anesthesia Plan: General   Post-op Pain Management: Minimal or no pain anticipated   Induction: Intravenous  PONV Risk Score and Plan: Propofol infusion  Airway Management Planned: Nasal Cannula and Natural Airway  Additional Equipment:   Intra-op Plan:   Post-operative Plan:   Informed Consent: I have reviewed the patients History and Physical, chart, labs and discussed the procedure including the risks, benefits and alternatives for the proposed anesthesia with the patient or authorized representative who has indicated his/her understanding and acceptance.     Dental advisory given  Plan Discussed with: CRNA and Surgeon  Anesthesia Plan Comments:         Anesthesia Quick Evaluation

## 2021-11-29 NOTE — H&P (Signed)
Bianca Vasquez is an 71 y.o. female.   Chief Complaint: Surveillance colon cancer HPI: 71 year old female with past medical history of stage I ascending colon cancer status post partial colectomy, depression, GERD, hypothyroidism, coming for surveillance of, cancer.  Patient underwent surgical resection in April 2023.  Did not undergo chemotherapy.  Denies having any complaints. The patient denies having any nausea, vomiting, fever, chills, hematochezia, melena, hematemesis, abdominal distention, abdominal pain, diarrhea, jaundice, pruritus or weight loss.   Past Medical History:  Diagnosis Date   Anemia    Cancer ([place])    colon   Depression    GERD (gastroesophageal reflux disease)    Hypothyroidism     Past Surgical History:  Procedure Laterality Date   ABDOMINAL HYSTERECTOMY     total   BIOPSY  06/22/2020   Procedure: BIOPSY;  Surgeon: Harvel Quale, MD;  [facility]: [facility];  Service: Gastroenterology;;  duodenum   CHOLECYSTECTOMY     COLECTOMY  07/2020   COLONOSCOPY     COLONOSCOPY WITH PROPOFOL N/A 06/22/2020   Procedure: COLONOSCOPY WITH PROPOFOL;  Surgeon: Harvel Quale, MD;  Location: [facility];  Service: Gastroenterology;  Laterality: N/A;  am   ESOPHAGOGASTRODUODENOSCOPY (EGD) WITH PROPOFOL N/A 06/22/2020   Procedure: ESOPHAGOGASTRODUODENOSCOPY (EGD) WITH PROPOFOL;  Surgeon: Harvel Quale, MD;  Location: [facility];  Service: Gastroenterology;  Laterality: N/A;   POLYPECTOMY  06/22/2020   Procedure: POLYPECTOMY;  Surgeon: Harvel Quale, MD;  Location: [facility];  Service: Gastroenterology;;  gastric   POLYPECTOMY  06/22/2020   Procedure: POLYPECTOMY INTESTINAL;  Surgeon: Harvel Quale, MD;  Location: [facility];  Service: Gastroenterology;;  colon    Family History  Problem Relation Age of Onset   COPD Mother    COPD Father    Healthy Brother    Heart disease Sister    Kidney  disease Sister    Fibromyalgia Sister    Migraines Sister    Emphysema Sister    Social History:  reports that she quit smoking about 34 years ago. Her smoking use included cigarettes. She smoked an average of .25 packs per day. She has never been exposed to tobacco smoke. She has never used smokeless tobacco. She reports that she does not drink alcohol and does not use drugs.  Allergies:  Allergies  Allergen Reactions   Augmentin [Amoxicillin-Pot Clavulanate] Nausea And Vomiting    Medications Prior to Admission  Medication Sig Dispense Refill   acidophilus (RISAQUAD) CAPS capsule Take 1 capsule by mouth daily.     amitriptyline (ELAVIL) 25 MG tablet Take 25 mg by mouth at bedtime.     atorvastatin (LIPITOR) 10 MG tablet Take 10 mg by mouth daily.     Biotin 5000 MCG TABS Take 5,000 mcg by mouth daily.     buPROPion (ZYBAN) 150 MG 12 hr tablet Take 150 mg by mouth daily.     CRANBERRY PO Take 1 capsule by mouth daily.     estradiol (ESTRACE) 1 MG tablet Take 1 mg by mouth daily.     famotidine (PEPCID) 20 MG tablet Take 20 mg by mouth at bedtime.     levothyroxine (SYNTHROID) 100 MCG tablet Take 100 mcg by mouth daily before breakfast.     loratadine (CLARITIN) 10 MG tablet Take 10 mg by mouth daily.     montelukast (SINGULAIR) 10 MG tablet Take 10 mg by mouth at bedtime.     Na Sulfate-K Sulfate-Mg Sulf (SUPREP BOWEL  PREP KIT) 17.5-3.13-1.6 GM/177ML SOLN Take 1 kit by mouth as directed. 354 mL 0   pantoprazole (PROTONIX) 40 MG tablet Take 1 tablet (40 mg total) by mouth daily. 90 tablet 1   PARoxetine (PAXIL) 40 MG tablet Take 40 mg by mouth every morning.     sertraline (ZOLOFT) 100 MG tablet Take 100 mg by mouth daily.      No results found for this or any previous visit (from the past 48 hour(s)). No results found.  Review of Systems  All other systems reviewed and are negative.   Blood pressure (!) 152/82, temperature 97.7 F (36.5 C), temperature source Oral, resp. rate  18, SpO2 97 %. Physical Exam  GENERAL: The patient is AO x3, in no acute distress. HEENT: Head is normocephalic and atraumatic. EOMI are intact. Mouth is well hydrated and without lesions. NECK: Supple. No masses LUNGS: Clear to auscultation. No presence of rhonchi/wheezing/rales. Adequate chest expansion HEART: RRR, normal s1 and s2. ABDOMEN: Soft, nontender, no guarding, no peritoneal signs, and nondistended. BS +. No masses. EXTREMITIES: Without any cyanosis, clubbing, rash, lesions or edema. NEUROLOGIC: AOx3, no focal motor deficit. SKIN: no jaundice, no rashes  Assessment/Plan 71 year old female with past medical history of stage I ascending colon cancer status post partial colectomy, depression, GERD, hypothyroidism, coming for surveillance of, cancer.  We will proceed with colonoscopy.  Harvel Quale, MD 11/29/2021, 8:31 AM

## 2021-11-29 NOTE — Transfer of Care (Signed)
Immediate Anesthesia Transfer of Care Note  Patient: Bianca Vasquez  Procedure(s) Performed: COLONOSCOPY WITH PROPOFOL  Patient Location: PACU  Anesthesia Type:MAC  Level of Consciousness: awake and patient cooperative  Airway & Oxygen Therapy: Patient Spontanous Breathing  Post-op Assessment: Report given to RN and Post -op Vital signs reviewed and stable  Post vital signs: Reviewed and stable  Last Vitals:  Vitals Value Taken Time  BP 133/56 11/29/21 0939  Temp 36.6 C 11/29/21 0939  Pulse 95 11/29/21 0939  Resp 17 11/29/21 0939  SpO2 99 % 11/29/21 0939    Last Pain:  Vitals:   11/29/21 0939  TempSrc: Oral  PainSc: 0-No pain      Patients Stated Pain Goal: 5 (24/49/75 3005)  Complications: No notable events documented.

## 2021-11-29 NOTE — Anesthesia Postprocedure Evaluation (Signed)
Anesthesia Post Note  Patient: Bianca Vasquez  Procedure(s) Performed: COLONOSCOPY WITH PROPOFOL  Patient location during evaluation: Phase II Anesthesia Type: General Level of consciousness: awake and alert and oriented Pain management: pain level controlled Vital Signs Assessment: post-procedure vital signs reviewed and stable Respiratory status: spontaneous breathing, nonlabored ventilation and respiratory function stable Cardiovascular status: blood pressure returned to baseline and stable Postop Assessment: no apparent nausea or vomiting Anesthetic complications: no   No notable events documented.   Last Vitals:  Vitals:   11/29/21 0759 11/29/21 0939  BP: (!) 152/82 (!) 133/56  Pulse:  95  Resp: 18 17  Temp: 36.5 C 36.6 C  SpO2: 97% 99%    Last Pain:  Vitals:   11/29/21 0939  TempSrc: Oral  PainSc: 0-No pain                 Jacarri Gesner C Enas Winchel

## 2021-12-05 ENCOUNTER — Encounter (HOSPITAL_COMMUNITY): Payer: Self-pay | Admitting: Gastroenterology

## 2021-12-06 ENCOUNTER — Ambulatory Visit (INDEPENDENT_AMBULATORY_CARE_PROVIDER_SITE_OTHER): Payer: Medicare Other | Admitting: Internal Medicine

## 2021-12-06 ENCOUNTER — Encounter: Payer: Self-pay | Admitting: Internal Medicine

## 2021-12-06 DIAGNOSIS — R058 Other specified cough: Secondary | ICD-10-CM

## 2021-12-06 NOTE — Progress Notes (Unsigned)
Bianca Vasquez, female    DOB: [birth year]   MRN: [id number]   Brief patient profile:  49  yowf quit smoking 1989 referred to pulmonary clinic 11/01/2021 by Tamela Gammon NP  for cough p Covid Dec 2022  s/p vax x 2 prior       allergies as child = "sinus problem" and  as adult allergy shots  in her 55s but "sniffed every since"     History of Present Illness  11/01/2021  Pulmonary/ 1st office eval/Marjory Meints onset  abrupt onset May 2023  with apparent severe viral uri  "bad cold with fever chills" and 2 neg covid tests  Chief Complaint  Patient presents with   Consult    Pt states she was sent here by Dr Woody Seller. Pt states she has been sick for 2 months with a cough and sore chest.  Dyspnea:  back to baseline  Cough: improved since onset 90%> no excess mucus  Sleep: fine flat now no resp cc  SABA use: nebulizer makes her cough  - last used 8 pm night prior to ov Still has same sniffles as baseline  Last abx for recurrent uti last Dec 2022 none were macrodantin  Energy level not better  is her main residual cc from her "URI"  Rec GERD diet /bed blocks  Pantoprazole (protonix) 40 mg   Take  30-60 min before first meal of the day and Pepcid (famotidine)  20 mg after supper until return to office    12/06/2021  f/u ov/[place] office/Yohan Samons re: cough since May 2023 maint on gerd rx   Chief Complaint  Patient presents with   Follow-up    Cough and sob have improved since using GERD regimen   Dyspnea:  no doe  Cough: gone and no longer using candy  Sleeping: electric bed raised  SABA use: no inhalers 02: none  Covid status: vax x 2 infected ? 2x      No obvious day to day or daytime variability or assoc excess/ purulent sputum or mucus plugs or hemoptysis or cp or chest tightness, subjective wheeze or overt sinus or hb symptoms.   *** without nocturnal  or early am exacerbation  of respiratory  c/o's or need for noct saba. Also denies any obvious fluctuation of symptoms with  weather or environmental changes or other aggravating or alleviating factors except as outlined above   No unusual exposure hx or h/o childhood pna/ asthma or knowledge of premature birth.  Current Allergies, Complete Past Medical History, Past Surgical History, Family History, and Social History were reviewed in [employer] record.  ROS  The following are not active complaints unless bolded Hoarseness, sore throat, dysphagia, dental problems, itching, sneezing,  nasal congestion or discharge of excess mucus or purulent secretions, ear ache,   fever, chills, sweats, unintended wt loss or wt gain, classically pleuritic or exertional cp,  orthopnea pnd or arm/hand swelling  or leg swelling, presyncope, palpitations, abdominal pain, anorexia, nausea, vomiting, diarrhea  or change in bowel habits or change in bladder habits, change in stools or change in urine, dysuria, hematuria,  rash, arthralgias, visual complaints, headache, numbness, weakness or ataxia or problems with walking or coordination,  change in mood or  memory.        Current Meds  Medication Sig   acidophilus (RISAQUAD) CAPS capsule Take 1 capsule by mouth daily.   amitriptyline (ELAVIL) 25 MG tablet Take 25 mg by mouth at bedtime.   atorvastatin (LIPITOR)  10 MG tablet Take 10 mg by mouth daily.   Biotin 5000 MCG TABS Take 5,000 mcg by mouth daily.   buPROPion (ZYBAN) 150 MG 12 hr tablet Take 150 mg by mouth daily.   CRANBERRY PO Take 1 capsule by mouth daily.   estradiol (ESTRACE) 1 MG tablet Take 1 mg by mouth daily.   famotidine (PEPCID) 20 MG tablet Take 20 mg by mouth at bedtime.   levothyroxine (SYNTHROID) 100 MCG tablet Take 100 mcg by mouth daily before breakfast.   loratadine (CLARITIN) 10 MG tablet Take 10 mg by mouth daily.   montelukast (SINGULAIR) 10 MG tablet Take 10 mg by mouth at bedtime.   pantoprazole (PROTONIX) 40 MG tablet Take 1 tablet (40 mg total) by mouth daily.   PARoxetine (PAXIL)  40 MG tablet Take 40 mg by mouth every morning.   sertraline (ZOLOFT) 100 MG tablet Take 100 mg by mouth daily.               Past Medical History:  Diagnosis Date   Anemia    Depression    GERD (gastroesophageal reflux disease)    Hypothyroidism        Objective:     Wt Readings from Last 3 Encounters:  12/06/21 187 lb 3.2 oz (84.9 kg)  11/25/21 184 lb 4.9 oz (83.6 kg)  11/01/21 184 lb 6.4 oz (83.6 kg)      Vital signs reviewed  12/06/2021  - Note at rest 02 sats  ***% on ***   General appearance:    ***            I personally reviewed images and agree with radiology impression as follows:   Chest CT s contrast 10/14/21  1. Fairly diffuse pattern of mosaic ground-glass attenuation in the lungs. This is typically seen with small airways disease such as asthma or respiratory bronchiolitis. 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 No worrisome pulmonary lesions or pulmonary nodules.  3. No mediastinal or hilar mass or adenopathy.  4. Suspect early changes of cirrhosis       Lab Results  Component Value Date   ESRSEDRATE 55 (H) 11/01/2021           Assessment

## 2021-12-06 NOTE — Patient Instructions (Signed)
Ok to take Pepcid as needed once the allergy season is over   If clariton not helping ok to try zyrtec as needed     If you are satisfied with your treatment plan,  let your doctor know and he/she can either refill your medications or you can return here when your prescription runs out.     If in any way you are not 100% satisfied,  please tell us.  If 100% better, tell your friends!  Pulmonary follow up is as needed

## 2021-12-07 ENCOUNTER — Encounter: Payer: Self-pay | Admitting: Internal Medicine

## 2021-12-07 NOTE — Assessment & Plan Note (Signed)
Allergies as child = "sinus problem" and  as adult allergy shots  in her 65s but "sniffed every since"  - flared with "viral URI"  May 2023 - rec max gerd rx  11/01/2021  - Allergy screen 11/01/2021 >  Eos 0.3 /  IgE  41  Marked improvement with min sense of pnds controlled with clariton and max gerd rx  - advised to continue same rx thru her worst season coming up in a few weeks and if worse can change clariton to zyrtec   Once allergy season over probably ok to resume prior longterm gerd rx and drop off the pm pepcid   F/u is prn          Each maintenance medication was reviewed in detail including emphasizing most importantly the difference between maintenance and prns and under what circumstances the prns are to be triggered using an action plan format where appropriate.  Total time for H and P, chart review, counseling,  and generating customized AVS unique to this office visit / same day charting = 24 min final summary f/u  ov

## 2022-01-10 DIAGNOSIS — Z299 Encounter for prophylactic measures, unspecified: Secondary | ICD-10-CM | POA: Diagnosis not present

## 2022-01-10 DIAGNOSIS — I10 Essential (primary) hypertension: Secondary | ICD-10-CM | POA: Diagnosis not present

## 2022-01-10 DIAGNOSIS — H6121 Impacted cerumen, right ear: Secondary | ICD-10-CM | POA: Diagnosis not present

## 2022-01-10 DIAGNOSIS — R350 Frequency of micturition: Secondary | ICD-10-CM | POA: Diagnosis not present

## 2022-01-10 DIAGNOSIS — Z789 Other specified health status: Secondary | ICD-10-CM | POA: Diagnosis not present

## 2022-01-19 DIAGNOSIS — Z23 Encounter for immunization: Secondary | ICD-10-CM | POA: Diagnosis not present

## 2022-01-25 DIAGNOSIS — L570 Actinic keratosis: Secondary | ICD-10-CM | POA: Diagnosis not present

## 2022-02-06 ENCOUNTER — Ambulatory Visit (INDEPENDENT_AMBULATORY_CARE_PROVIDER_SITE_OTHER): Payer: Medicare Other | Admitting: Internal Medicine

## 2022-02-06 ENCOUNTER — Telehealth: Payer: Self-pay | Admitting: Internal Medicine

## 2022-02-06 ENCOUNTER — Ambulatory Visit (INDEPENDENT_AMBULATORY_CARE_PROVIDER_SITE_OTHER): Payer: Medicare Other

## 2022-02-06 ENCOUNTER — Encounter: Payer: Self-pay | Admitting: Internal Medicine

## 2022-02-06 DIAGNOSIS — R058 Other specified cough: Secondary | ICD-10-CM

## 2022-02-06 DIAGNOSIS — R059 Cough, unspecified: Secondary | ICD-10-CM | POA: Diagnosis not present

## 2022-02-06 MED ORDER — ACETAMINOPHEN-CODEINE 300-30 MG PO TABS: 1.0000 | ORAL_TABLET | ORAL | 0 refills | Status: AC | PRN

## 2022-02-06 MED ORDER — AZITHROMYCIN 250 MG PO TABS: ORAL_TABLET | ORAL | 0 refills | Status: DC

## 2022-02-06 MED ORDER — ACETAMINOPHEN-CODEINE 300-30 MG PO TABS: 1.0000 | ORAL_TABLET | ORAL | 0 refills | Status: DC | PRN

## 2022-02-06 MED ORDER — PREDNISONE 10 MG PO TABS: ORAL_TABLET | ORAL | 0 refills | Status: DC

## 2022-02-06 NOTE — Telephone Encounter (Signed)
Sorry to hear she's not feeling well  - needs to do a home Covid test before coming to office or go to [facility] if can't get one or we can't get her in this week with all active meds in hand.    I don't have any additional recs but can try delsym 2 tsp q 12 h prn (not to mix with other cough meds)

## 2022-02-06 NOTE — Progress Notes (Unsigned)
Bianca Vasquez, Bianca Vasquez    DOB: Sep 12, 1950   MRN: [id number]   Brief patient profile:  69  yowf quit smoking 1989 referred to pulmonary clinic 11/01/2021 by Tamela Gammon NP  for cough p Covid Dec 2022  s/p vax x 2 prior       allergies as child = "sinus problem" and  as adult allergy shots  in her 67s but "sniffed every since"     History of Present Illness  11/01/2021  Pulmonary/ 1st Vasquez eval/Bianca Vasquez onset  abrupt onset May 2023  with apparent severe viral uri  "bad cold with fever chills" and 2 neg covid tests  Chief Complaint  Patient presents with   Consult    Pt states she was sent here by Dr Woody Seller. Pt states she has been sick for 2 months with a cough and sore chest.  Dyspnea:  back to baseline  Cough: improved since onset 90%> no excess mucus  Sleep: fine flat now no resp cc  SABA use: nebulizer makes her cough  - last used 8 pm night prior to ov Still has same sniffles as baseline  Last abx for recurrent uti last Dec 2022 none were macrodantin  Energy level not better  is her main residual cc from her "URI"  Rec GERD diet /bed blocks  Pantoprazole (protonix) 40 mg   Take  30-60 min before first meal of the day and Pepcid (famotidine)  20 mg after supper until return to Vasquez    12/06/2021  f/u ov/Bianca Vasquez/Bianca Vasquez re: cough since May 2023 maint on gerd rx   Chief Complaint  Patient presents with   Follow-up    Cough and sob have improved since using GERD regimen   Dyspnea:  no doe  Cough: gone and no longer suppressing it with hard rock candy  Sleeping: electric bed raised 30 degrees SABA use: no inhalers 02: none  Covid status: vax x 2 infected ? 2x  Rec Ok to take Pepcid as needed once the allergy season is over  If clariton not helping ok to try zyrtec as needed     02/06/2022  Acute ov/Bianca Vasquez re: recurrent cough  maint on gerd rx / singular  Chief Complaint  Patient presents with   Sore Throat    Sore throat, nose burning, runny nose, cough x  10 days. Some wheezing and dyspnea   Dyspnea:  mostly when cough  Cough: rattling / assoc with watery nasal discharge Sleeping: does settle down @ 30 degree bed  SABA use: none / never helped in past similar situtations 02: none  Covid home testing neg x 2/ no sick contacts    No obvious day to day or daytime variability or assoc excess/ purulent sputum or mucus plugs or hemoptysis or cp or chest tightness, subjective wheeze or overt   hb symptoms.   Sleeping  without nocturnal  or early am exacerbation  of respiratory  c/o's or need for noct saba. Also denies any obvious fluctuation of symptoms with weather or environmental changes or other aggravating or alleviating factors except as outlined above   No unusual exposure hx or h/o childhood pna/ asthma or knowledge of premature birth.  Current Allergies, Complete Past Medical History, Past Surgical History, Family History, and Social History were reviewed in [employer] record.  ROS  The following are not active complaints unless bolded Hoarseness, sore throat, dysphagia, dental problems, itching, sneezing,  nasal congestion or discharge of excess mucus or purulent  secretions, ear ache,   fever, chills, sweats, unintended wt loss or wt gain, classically pleuritic or exertional cp,  orthopnea pnd or arm/hand swelling  or leg swelling, presyncope, palpitations, abdominal pain, anorexia, nausea, vomiting, diarrhea  or change in bowel habits or change in bladder habits, change in stools or change in urine, dysuria, hematuria,  rash, arthralgias, visual complaints, headache, numbness, weakness or ataxia or problems with walking or coordination,  change in mood or  memory.        Current Meds  Medication Sig   acidophilus (RISAQUAD) CAPS capsule Take 1 capsule by mouth daily.   amitriptyline (ELAVIL) 25 MG tablet Take 25 mg by mouth at bedtime.   atorvastatin (LIPITOR) 10 MG tablet Take 10 mg by mouth daily.   Biotin 5000  MCG TABS Take 5,000 mcg by mouth daily.   buPROPion (ZYBAN) 150 MG 12 hr tablet Take 150 mg by mouth daily.        CRANBERRY PO Take 1 capsule by mouth daily.   estradiol (ESTRACE) 1 MG tablet Take 1 mg by mouth daily.   famotidine (PEPCID) 20 MG tablet Take 20 mg by mouth at bedtime.   levothyroxine (SYNTHROID) 100 MCG tablet Take 100 mcg by mouth daily before breakfast.   loratadine (CLARITIN) 10 MG tablet Take 10 mg by mouth daily.   montelukast (SINGULAIR) 10 MG tablet Take 10 mg by mouth at bedtime.   pantoprazole (PROTONIX) 40 MG tablet Take 1 tablet (40 mg total) by mouth daily.   PARoxetine (PAXIL) 40 MG tablet Take 40 mg by mouth every morning.   sertraline (ZOLOFT) 100 MG tablet Take 100 mg by mouth daily.              Past Medical History:  Diagnosis Date   Anemia    Depression    GERD (gastroesophageal reflux disease)    Hypothyroidism        Objective:     02/06/2022     188  12/06/21 187 lb 3.2 oz (84.9 kg)  11/25/21 184 lb 4.9 oz (83.6 kg)  11/01/21 184 lb 6.4 oz (83.6 kg)     Vital signs reviewed  02/06/2022  - Note at rest 02 sats  96% on RA   General appearance:    amb non-toxic but uncomfortable appearing wf nad   HEENT : Oropharynx  min erythema/ min watery pnd       NECK :  without  apparent JVD/ palpable Nodes/TM    LUNGS: no acc muscle use,  Nl contour chest with insp/exp mild rhonchi bilaterally without cough on insp or exp maneuvers   CV:  RRR  no s3 or murmur or increase in P2, and no edema   ABD:  soft and nontender with nl inspiratory excursion in the supine position. No bruits or organomegaly appreciated   MS:  Nl gait/ ext warm without deformities Or obvious joint restrictions  calf tenderness, cyanosis or clubbing    SKIN: warm and dry without lesions    NEURO:  alert, approp, nl sensorium with  no motor or cerebellar deficits apparent.     CXR PA and Lateral:   02/06/2022 :    I personally reviewed images and impression is  as follows:     No acute changes        Assessment

## 2022-02-06 NOTE — Telephone Encounter (Signed)
Primary Pulmonologist: MW Last office visit and with whom: MW 12/06/21 What do we see them for (pulmonary problems): Upper airway cough syndrome  Last OV assessment/plan: Allergies as child = "sinus problem" and  as adult allergy shots  in her 34s but "sniffed every since"  - flared with "viral URI"  May 2023 - rec max gerd rx  11/01/2021  - Allergy screen 11/01/2021 >  Eos 0.3 /  IgE  41   Marked improvement with min sense of pnds controlled with clariton and max gerd rx  - advised to continue same rx thru her worst season coming up in a few weeks and if worse can change clariton to zyrtec    Once allergy season over probably ok to resume prior longterm gerd rx and drop off the pm pepcid    F/u is prn     Was appointment offered to patient (explain)?  Pt wanted advice.  Reason for call: Spoke with pt who states that she thought she was getting a cold 10 days ago because she developed a sore throat and a productive cough. Since that time sore throat resolved but productive cough and runny nose are still on going. Pt denies fever/ chills/ GI upset. Pt did state increased fatigue yesterday. Dr. Melvyn Novas please advise.   (examples of things to ask: : When did symptoms start? Fever? Cough? Productive? Color to sputum? More sputum than usual? Wheezing? Have you needed increased oxygen? Are you taking your respiratory medications? What over the counter measures have you tried?)  Allergies  Allergen Reactions   Augmentin [Amoxicillin-Pot Clavulanate] Nausea And Vomiting    Immunization History  Administered Date(s) Administered   Moderna Sars-Covid-2 Vaccination 06/05/2019, 11/06/2019, 06/08/2020

## 2022-02-06 NOTE — Patient Instructions (Signed)
Zpak   Prednisone 10 mg take  4 each am x 2 days,   2 each am x 2 days,  1 each am x 2 days and stop   Take delsym two tsp (or mucinex dm 1200 mg) every 12 hours and supplement if needed with  Tylenol #3   up to 1-2 every 4 hours to suppress the urge to cough. Swallowing water and/or using ice chips/non mint and menthol containing candies (such as lifesavers or sugarless jolly ranchers) are also effective.  You should rest your voice and avoid activities that you know make you cough.  Once you have eliminated the cough for 3 straight days try reducing the Tylenol #3 first,  then the delsym as tolerated.      Please remember to go to the  x-ray department  for your tests - we will call you with the results when they are available

## 2022-02-06 NOTE — Telephone Encounter (Signed)
Spoke with the pt  She has already taken 2 covid test since symptoms started  Both negative  Appt with MW at 4:15 and I advised bring all meds including otc and arrive by 4 pm  Pt verbalized understanding

## 2022-02-07 ENCOUNTER — Encounter: Payer: Self-pay | Admitting: Internal Medicine

## 2022-02-07 NOTE — Assessment & Plan Note (Signed)
Allergies as child = "sinus problem" and  as adult allergy shots  in her 38s but "sniffed every since"  - flared with "viral URI"  May 2023 - rec max gerd rx  11/01/2021  - Allergy screen 11/01/2021 >  Eos 0.3 /  IgE  41  Assoc with rhinitis/pnds and some evidence of bronchitis on today's exam   Of the three most common causes of  Sub-acute / recurrent or chronic cough, only one (GERD)  can actually contribute to/ trigger  the other two (asthma and post nasal drip syndrome)  and perpetuate the cylce of cough.  While not intuitively obvious, many patients with chronic low grade reflux do not cough until there is a primary insult that disturbs the protective epithelial barrier and exposes sensitive nerve endings.   This is typically viral but can due to PNDS and  Both  may apply here.     >>>The point is that once this occurs, it is difficult to eliminate the cycle  using anything but a maximally effective acid suppression regimen at least in the short run, accompanied by an appropriate diet to address non acid GERD and control / eliminate the cough itself for at least 3 days with tylenol #3 >>> also so added 6 day taper off  Prednisone starting at 40 mg per day in case of component of Th-2 driven upper or lower airways inflammation (if cough responds short term only to relapse before return while will on full rx for uacs (as above), then  that would point to allergic rhinitis/ asthma or eos bronchitis as alternative dx)   zpak empirically though most likely this was virus triggered/ advised          Each maintenance medication was reviewed in detail including emphasizing most importantly the difference between maintenance and prns and under what circumstances the prns are to be triggered using an action plan format where appropriate.  Total time for H and P, chart review, counseling,   and generating customized AVS unique to this Acute  office visit / same day charting = 32 min

## 2022-02-08 NOTE — Progress Notes (Signed)
Spoke with pt and notified of results per Dr. Wert. Pt verbalized understanding and denied any questions. 

## 2022-02-20 DIAGNOSIS — N898 Other specified noninflammatory disorders of vagina: Secondary | ICD-10-CM | POA: Diagnosis not present

## 2022-03-02 DIAGNOSIS — L659 Nonscarring hair loss, unspecified: Secondary | ICD-10-CM | POA: Diagnosis not present

## 2022-03-02 DIAGNOSIS — I10 Essential (primary) hypertension: Secondary | ICD-10-CM | POA: Diagnosis not present

## 2022-03-02 DIAGNOSIS — R5383 Other fatigue: Secondary | ICD-10-CM | POA: Diagnosis not present

## 2022-03-02 DIAGNOSIS — Z299 Encounter for prophylactic measures, unspecified: Secondary | ICD-10-CM | POA: Diagnosis not present

## 2022-03-02 DIAGNOSIS — Z789 Other specified health status: Secondary | ICD-10-CM | POA: Diagnosis not present

## 2022-03-02 DIAGNOSIS — E039 Hypothyroidism, unspecified: Secondary | ICD-10-CM | POA: Diagnosis not present

## 2022-03-08 DIAGNOSIS — M170 Bilateral primary osteoarthritis of knee: Secondary | ICD-10-CM | POA: Diagnosis not present

## 2022-04-04 DIAGNOSIS — H43811 Vitreous degeneration, right eye: Secondary | ICD-10-CM | POA: Diagnosis not present

## 2022-04-04 DIAGNOSIS — H1045 Other chronic allergic conjunctivitis: Secondary | ICD-10-CM | POA: Diagnosis not present

## 2022-04-27 DIAGNOSIS — L309 Dermatitis, unspecified: Secondary | ICD-10-CM | POA: Diagnosis not present

## 2022-04-27 DIAGNOSIS — D485 Neoplasm of uncertain behavior of skin: Secondary | ICD-10-CM | POA: Diagnosis not present

## 2022-05-04 ENCOUNTER — Other Ambulatory Visit: Payer: Self-pay | Admitting: Internal Medicine

## 2022-05-15 DIAGNOSIS — E785 Hyperlipidemia, unspecified: Secondary | ICD-10-CM | POA: Diagnosis not present

## 2022-05-15 DIAGNOSIS — Z880 Allergy status to penicillin: Secondary | ICD-10-CM | POA: Diagnosis not present

## 2022-05-15 DIAGNOSIS — E039 Hypothyroidism, unspecified: Secondary | ICD-10-CM | POA: Diagnosis not present

## 2022-05-15 DIAGNOSIS — Z79899 Other long term (current) drug therapy: Secondary | ICD-10-CM | POA: Diagnosis not present

## 2022-05-15 DIAGNOSIS — R0789 Other chest pain: Secondary | ICD-10-CM | POA: Diagnosis not present

## 2022-05-15 DIAGNOSIS — R079 Chest pain, unspecified: Secondary | ICD-10-CM | POA: Diagnosis not present

## 2022-05-15 DIAGNOSIS — Z79890 Hormone replacement therapy (postmenopausal): Secondary | ICD-10-CM | POA: Diagnosis not present

## 2022-05-15 DIAGNOSIS — K219 Gastro-esophageal reflux disease without esophagitis: Secondary | ICD-10-CM | POA: Diagnosis not present

## 2022-06-28 DIAGNOSIS — M170 Bilateral primary osteoarthritis of knee: Secondary | ICD-10-CM | POA: Diagnosis not present

## 2022-06-29 DIAGNOSIS — R42 Dizziness and giddiness: Secondary | ICD-10-CM | POA: Diagnosis not present

## 2022-06-29 DIAGNOSIS — M9901 Segmental and somatic dysfunction of cervical region: Secondary | ICD-10-CM | POA: Diagnosis not present

## 2022-06-29 DIAGNOSIS — M47812 Spondylosis without myelopathy or radiculopathy, cervical region: Secondary | ICD-10-CM | POA: Diagnosis not present

## 2022-06-29 DIAGNOSIS — S233XXA Sprain of ligaments of thoracic spine, initial encounter: Secondary | ICD-10-CM | POA: Diagnosis not present

## 2022-07-04 DIAGNOSIS — M47812 Spondylosis without myelopathy or radiculopathy, cervical region: Secondary | ICD-10-CM | POA: Diagnosis not present

## 2022-07-04 DIAGNOSIS — R42 Dizziness and giddiness: Secondary | ICD-10-CM | POA: Diagnosis not present

## 2022-07-04 DIAGNOSIS — S233XXA Sprain of ligaments of thoracic spine, initial encounter: Secondary | ICD-10-CM | POA: Diagnosis not present

## 2022-07-04 DIAGNOSIS — M9901 Segmental and somatic dysfunction of cervical region: Secondary | ICD-10-CM | POA: Diagnosis not present

## 2022-07-17 DIAGNOSIS — R42 Dizziness and giddiness: Secondary | ICD-10-CM | POA: Diagnosis not present

## 2022-07-17 DIAGNOSIS — M47812 Spondylosis without myelopathy or radiculopathy, cervical region: Secondary | ICD-10-CM | POA: Diagnosis not present

## 2022-07-17 DIAGNOSIS — M9901 Segmental and somatic dysfunction of cervical region: Secondary | ICD-10-CM | POA: Diagnosis not present

## 2022-07-17 DIAGNOSIS — S233XXA Sprain of ligaments of thoracic spine, initial encounter: Secondary | ICD-10-CM | POA: Diagnosis not present

## 2022-07-19 DIAGNOSIS — Z299 Encounter for prophylactic measures, unspecified: Secondary | ICD-10-CM | POA: Diagnosis not present

## 2022-07-19 DIAGNOSIS — M8580 Other specified disorders of bone density and structure, unspecified site: Secondary | ICD-10-CM | POA: Diagnosis not present

## 2022-07-19 DIAGNOSIS — I10 Essential (primary) hypertension: Secondary | ICD-10-CM | POA: Diagnosis not present

## 2022-07-19 DIAGNOSIS — Z0000 Encounter for general adult medical examination without abnormal findings: Secondary | ICD-10-CM | POA: Diagnosis not present

## 2022-07-19 DIAGNOSIS — E7800 Pure hypercholesterolemia, unspecified: Secondary | ICD-10-CM | POA: Diagnosis not present

## 2022-07-19 DIAGNOSIS — Z7189 Other specified counseling: Secondary | ICD-10-CM | POA: Diagnosis not present

## 2022-07-19 DIAGNOSIS — R5383 Other fatigue: Secondary | ICD-10-CM | POA: Diagnosis not present

## 2022-07-19 DIAGNOSIS — Z79899 Other long term (current) drug therapy: Secondary | ICD-10-CM | POA: Diagnosis not present

## 2022-07-19 DIAGNOSIS — F3342 Major depressive disorder, recurrent, in full remission: Secondary | ICD-10-CM | POA: Diagnosis not present

## 2022-07-19 DIAGNOSIS — E039 Hypothyroidism, unspecified: Secondary | ICD-10-CM | POA: Diagnosis not present

## 2022-07-19 DIAGNOSIS — Z1331 Encounter for screening for depression: Secondary | ICD-10-CM | POA: Diagnosis not present

## 2022-07-19 DIAGNOSIS — Z1339 Encounter for screening examination for other mental health and behavioral disorders: Secondary | ICD-10-CM | POA: Diagnosis not present

## 2022-07-20 DIAGNOSIS — M47812 Spondylosis without myelopathy or radiculopathy, cervical region: Secondary | ICD-10-CM | POA: Diagnosis not present

## 2022-07-20 DIAGNOSIS — R42 Dizziness and giddiness: Secondary | ICD-10-CM | POA: Diagnosis not present

## 2022-07-20 DIAGNOSIS — M9901 Segmental and somatic dysfunction of cervical region: Secondary | ICD-10-CM | POA: Diagnosis not present

## 2022-07-20 DIAGNOSIS — S233XXA Sprain of ligaments of thoracic spine, initial encounter: Secondary | ICD-10-CM | POA: Diagnosis not present

## 2022-07-23 ENCOUNTER — Other Ambulatory Visit: Payer: Self-pay | Admitting: Internal Medicine

## 2022-09-19 DIAGNOSIS — Z299 Encounter for prophylactic measures, unspecified: Secondary | ICD-10-CM | POA: Diagnosis not present

## 2022-09-19 DIAGNOSIS — G250 Essential tremor: Secondary | ICD-10-CM | POA: Diagnosis not present

## 2022-09-19 DIAGNOSIS — R269 Unspecified abnormalities of gait and mobility: Secondary | ICD-10-CM | POA: Diagnosis not present

## 2022-09-19 DIAGNOSIS — I10 Essential (primary) hypertension: Secondary | ICD-10-CM | POA: Diagnosis not present

## 2022-11-06 DIAGNOSIS — Z1231 Encounter for screening mammogram for malignant neoplasm of breast: Secondary | ICD-10-CM | POA: Diagnosis not present

## 2022-11-15 DIAGNOSIS — Z79890 Hormone replacement therapy (postmenopausal): Secondary | ICD-10-CM | POA: Diagnosis not present

## 2022-11-15 DIAGNOSIS — N951 Menopausal and female climacteric states: Secondary | ICD-10-CM | POA: Diagnosis not present

## 2022-11-15 DIAGNOSIS — Z8659 Personal history of other mental and behavioral disorders: Secondary | ICD-10-CM | POA: Diagnosis not present

## 2022-11-29 DIAGNOSIS — M170 Bilateral primary osteoarthritis of knee: Secondary | ICD-10-CM | POA: Diagnosis not present

## 2022-12-04 DIAGNOSIS — I10 Essential (primary) hypertension: Secondary | ICD-10-CM | POA: Diagnosis not present

## 2022-12-04 DIAGNOSIS — Z299 Encounter for prophylactic measures, unspecified: Secondary | ICD-10-CM | POA: Diagnosis not present

## 2022-12-04 DIAGNOSIS — J029 Acute pharyngitis, unspecified: Secondary | ICD-10-CM | POA: Diagnosis not present

## 2022-12-05 DIAGNOSIS — S233XXA Sprain of ligaments of thoracic spine, initial encounter: Secondary | ICD-10-CM | POA: Diagnosis not present

## 2022-12-05 DIAGNOSIS — R42 Dizziness and giddiness: Secondary | ICD-10-CM | POA: Diagnosis not present

## 2022-12-05 DIAGNOSIS — M9901 Segmental and somatic dysfunction of cervical region: Secondary | ICD-10-CM | POA: Diagnosis not present

## 2022-12-05 DIAGNOSIS — M47812 Spondylosis without myelopathy or radiculopathy, cervical region: Secondary | ICD-10-CM | POA: Diagnosis not present

## 2022-12-11 DIAGNOSIS — R42 Dizziness and giddiness: Secondary | ICD-10-CM | POA: Diagnosis not present

## 2022-12-11 DIAGNOSIS — M47812 Spondylosis without myelopathy or radiculopathy, cervical region: Secondary | ICD-10-CM | POA: Diagnosis not present

## 2022-12-11 DIAGNOSIS — M9901 Segmental and somatic dysfunction of cervical region: Secondary | ICD-10-CM | POA: Diagnosis not present

## 2022-12-11 DIAGNOSIS — S233XXA Sprain of ligaments of thoracic spine, initial encounter: Secondary | ICD-10-CM | POA: Diagnosis not present

## 2022-12-19 ENCOUNTER — Ambulatory Visit (INDEPENDENT_AMBULATORY_CARE_PROVIDER_SITE_OTHER): Payer: Medicare Other | Admitting: Gastroenterology

## 2022-12-19 ENCOUNTER — Encounter (INDEPENDENT_AMBULATORY_CARE_PROVIDER_SITE_OTHER): Payer: Self-pay | Admitting: Gastroenterology

## 2022-12-19 VITALS — BP 131/83 | HR 82 | Temp 97.1°F | Ht 65.0 in | Wt 186.5 lb

## 2022-12-19 DIAGNOSIS — R159 Full incontinence of feces: Secondary | ICD-10-CM | POA: Diagnosis not present

## 2022-12-19 DIAGNOSIS — R197 Diarrhea, unspecified: Secondary | ICD-10-CM

## 2022-12-19 NOTE — Patient Instructions (Signed)
I will check some labs and stool studies today  Let's try starting benefiber 1T daily with a meal, you can increase this to up to 2-3 times per day, this will help to bulk up your stools Please keep a food/stool journal as well to determine any specific triggers contributing to your diarrhea/incontinence As discussed, certain forms of magnesium can actually elicit diarrhea, (magnesium carbonate, chloride, gluconate, oxide, citrate) are the forms that tend to cause more diarrhea. Magnesium glycinate and malate are less likely to cause diarrhea.   Follow up 3 months

## 2022-12-19 NOTE — Progress Notes (Signed)
Referring Provider: Wendall Papa, NP Primary Care Physician:  Wendall Papa Primary GI Physician: Dr. Levon Hedger   Chief Complaint  Patient presents with   Diarrhea    Patient here today for a follow upon abdominal pain and diarrhea. She is having issues with fecal incontinence. She says she will take imodium if she is planning on going out.    Gastroesophageal Reflux    Patient states she is doing well on pantoprazole 40 mg once daily and famotidine 20 mg at bedtime.   HPI:   Bianca Vasquez is a 72 y.o. female with past medical history of depression, hypothyroidism, GERD, HLD, colon cancer s/p ileocolectomy  Patient presenting today for diarrhea and fecal incontinence  Last seen 2022, at that time referred for IDA. Also having diarrhea, nausea, vomiting, abdominal cramping. Taking protonix 40mg  daily for gerd  Recommended to schedule EGD/Colonoscopy, Celiac panel  Colonoscopy in 2022 with malignant tumor in proximal ascending colon, pt underwent ileocolectomy thereafter  Present: Patient states that she has had stomach issues all her life. Recently she has had larger volume episodes of fecal incontinence noting stool will just flow out of her, however, she reports that stools at this time are very thick, not watery. This has been going on for the past few months,it occurs intermittently. She notes the last episode she had, she had given blood and it occurred thereafter. In between these episodes, she may have more formed stools, other times she may have watery stools. Sometimes she can have BMs everytime she eats. She denies rectal bleeding or melena. She has occasional lower abdominal pain that she feels may be gas related. She feels that imodium does help some. Does not seem to depend on what she is eating. She notes that she has about 75% diarrhea and 25% solid stools, this has been ongoing since her ilectomy.   She does note she is taking more magnesium recently,  doing 500mg  daily now, she thinks she started this 1 week ago, prior to that was taking 250mg  magnesium for about 6 months.   She did take a course of antibiotics for 7 days, she is unsure of the name of it, this was about 10-12 days ago. Notes this did not seem to change her BM habits.   Last labs in january with normal CBC, CMP with AST 12, Alk phos 127  GERD well controlled on protonix 40mg  daily, PCP fills this   Last Colonoscopy:2023 - The examined portion of the ileum was normal.                           - Patent end-to-side ileo-colonic anastomosis,                            characterized by healthy appearing mucosa.                           - The distal rectum and anal verge are normal on                            retroflexion view.                           - No specimens collected. Last Endoscopy:06/2020 - Normal esophagus.                           -  Multiple gastric polyps. Resected and retrieved.                           - Nodular mucosa in the second portion of the                            duodenum. Biopsied. No celiac, duodenal TA, fundic gland polyps                            - Normal rest of the examined duodenum. Biopsied.  Recommendations:    Past Medical History:  Diagnosis Date   Anemia    Cancer (HCC)    colon   Depression    GERD (gastroesophageal reflux disease)    Hypothyroidism     Past Surgical History:  Procedure Laterality Date   ABDOMINAL HYSTERECTOMY     total   BIOPSY  06/22/2020   Procedure: BIOPSY;  Surgeon: Dolores Frame, MD;  Location: [facility];  Service: Gastroenterology;;  duodenum   CHOLECYSTECTOMY     COLECTOMY  07/2020   COLONOSCOPY     COLONOSCOPY WITH PROPOFOL N/A 06/22/2020   Procedure: COLONOSCOPY WITH PROPOFOL;  Surgeon: Dolores Frame, MD;  Location: [facility];  Service: Gastroenterology;  Laterality: N/A;  am   COLONOSCOPY WITH PROPOFOL N/A 11/29/2021   Procedure: COLONOSCOPY WITH  PROPOFOL;  Surgeon: Dolores Frame, MD;  Location: [facility];  Service: Gastroenterology;  Laterality: N/A;  915 ASA 2   ESOPHAGOGASTRODUODENOSCOPY (EGD) WITH PROPOFOL N/A 06/22/2020   Procedure: ESOPHAGOGASTRODUODENOSCOPY (EGD) WITH PROPOFOL;  Surgeon: Dolores Frame, MD;  Location: [facility];  Service: Gastroenterology;  Laterality: N/A;   POLYPECTOMY  06/22/2020   Procedure: POLYPECTOMY;  Surgeon: Dolores Frame, MD;  Location: [facility];  Service: Gastroenterology;;  gastric   POLYPECTOMY  06/22/2020   Procedure: POLYPECTOMY INTESTINAL;  Surgeon: Marguerita Merles, Reuel Boom, MD;  Location: [facility];  Service: Gastroenterology;;  colon    Current Outpatient Medications  Medication Sig Dispense Refill   acidophilus (RISAQUAD) CAPS capsule Take 1 capsule by mouth daily.     amitriptyline (ELAVIL) 25 MG tablet Take 25 mg by mouth at bedtime.     Apoaequorin (PREVAGEN PO) Take by mouth daily at 6 (six) AM.     atorvastatin (LIPITOR) 10 MG tablet Take 10 mg by mouth daily.     Biotin 5000 MCG TABS Take 5,000 mcg by mouth daily.     buPROPion (ZYBAN) 150 MG 12 hr tablet Take 150 mg by mouth daily.     CRANBERRY PO Take 1 capsule by mouth daily.     cyanocobalamin (VITAMIN B12) 1000 MCG tablet Take 2,500 mcg by mouth daily.     estradiol (ESTRACE) 1 MG tablet Take 1 mg by mouth daily.     famotidine (PEPCID) 20 MG tablet Take 20 mg by mouth at bedtime.     levothyroxine (SYNTHROID) 100 MCG tablet Take 100 mcg by mouth daily before breakfast.     magnesium 30 MG tablet Take 30 mg by mouth daily.     montelukast (SINGULAIR) 10 MG tablet Take 10 mg by mouth at bedtime.     pantoprazole (PROTONIX) 40 MG tablet TAKE 1 TABLET BY MOUTH DAILY 90 tablet 1   sertraline (ZOLOFT) 100 MG tablet Take 100 mg by mouth daily.  loratadine (CLARITIN) 10 MG tablet Take 10 mg by mouth daily.     No current facility-administered medications for this visit.     Allergies as of 12/19/2022 - Review Complete 12/19/2022  Allergen Reaction Noted   Augmentin [amoxicillin-pot clavulanate] Nausea And Vomiting 06/03/2020    Family History  Problem Relation Age of Onset   COPD Mother    COPD Father    Healthy Brother    Heart disease Sister    Kidney disease Sister    Fibromyalgia Sister    Migraines Sister    Emphysema Sister     Social History   Socioeconomic History   Marital status: Married    Spouse name: Not on file   Number of children: Not on file   Years of education: Not on file   Highest education level: Not on file  Occupational History   Not on file  Tobacco Use   Smoking status: Former    Current packs/day: 0.00    Types: Cigarettes    Quit date: 1989    Years since quitting: 35.6    Passive exposure: Never   Smokeless tobacco: Never   Tobacco comments:    Quit 35 years ago  Vaping Use   Vaping status: Never Used  Substance and Sexual Activity   Alcohol use: Never   Drug use: Never   Sexual activity: Not on file  Other Topics Concern   Not on file  Social History Narrative   Not on file   Social Determinants of Health   Financial Resource Strain: Low Risk  (08/10/2020)   Received from [facility], [facility]   Overall Financial Resource Strain (CARDIA)    Difficulty of Paying Living Expenses: Not hard at all  Food Insecurity: No Food Insecurity (08/23/2021)   Received [facility], [facility]   Hunger Vital Sign    Worried About Running Out of Food in the Last Year: Never true    Ran Out of Food in the Last Year: Never true  Transportation Needs: Not on file  Physical Activity: Not on file  Stress: No Stress Concern Present (08/10/2020)   Received from [facility], [facility]   [employer] of Occupational Health - Occupational Stress Questionnaire    Feeling of Stress : Not at all  Social Connections: Unknown (08/21/2021)   Received from [employer], [facility]   Social  Network    Social Network: Not on file   Review of systems General: negative for malaise, night sweats, fever, chills, weight loss Neck: Negative for lumps, goiter, pain and significant neck swelling Resp: Negative for cough, wheezing, dyspnea at rest CV: Negative for chest pain, leg swelling, palpitations, orthopnea GI: denies melena, hematochezia, nausea, vomiting, constipation, dysphagia, odyonophagia, early satiety or unintentional weight loss. +diarrhea +fecal incontinence  MSK: Negative for joint pain or swelling, back pain, and muscle pain. Derm: Negative for itching or rash Psych: Denies depression, anxiety, memory loss, confusion. No homicidal or suicidal ideation.  Heme: Negative for prolonged bleeding, bruising easily, and swollen nodes. Endocrine: Negative for cold or heat intolerance, polyuria, polydipsia and goiter. Neuro: negative for tremor, gait imbalance, syncope and seizures. The remainder of the review of systems is noncontributory.  Physical Exam: BP 131/83 (BP Location: Left Arm, Patient Position: Sitting, Cuff Size: Large)   Pulse 82   Temp (!) 97.1 F (36.2 C) (Temporal)   Ht 5\' 5"  (1.651 m)   Wt 186 lb 8 oz (84.6 kg)   BMI 31.04  kg/m  General:   Alert and oriented. No distress noted. Pleasant and cooperative.  Head:  Normocephalic and atraumatic. Eyes:  Conjuctiva clear without scleral icterus. Mouth:  Oral mucosa pink and moist. Good dentition. No lesions. Heart: Normal rate and rhythm, s1 and s2 heart sounds present.  Lungs: Clear lung sounds in all lobes. Respirations equal and unlabored. Abdomen:  +BS, soft, non-tender and non-distended. No rebound or guarding. No HSM or masses noted. Derm: No palmar erythema or jaundice Msk:  Symmetrical without gross deformities. Normal posture. Extremities:  Without edema. Neurologic:  Alert and  oriented x4 Psych:  Alert and cooperative. Normal mood and affect.  Invalid input(s): "6 MONTHS"    ASSESSMENT: Bianca Vasquez is a 72 y.o. female presenting today for diarrhea and fecal incontinence  Previous ileocolectomy in 2022 due to findings of colon cancer. She notes more diarrhea since though recently with larger volume episodes of fecal incontinence noting this occurs intermittently at random and with "thicker" stools. She has some occasional lower abdominal pain, no rectal bleeding or melena. Recent Colonoscopy in 2023 was normal. Fecal urgency/diarrhea could be secondary to previous colon surgery. She is also on magnesium and recently started a higher dose, uncertain what form, this could also be contributing. Etiology is unclear at this time. Given diarrhea has been chronic, will check TSH, fecal fat and pancreatic fecal elastase. Encouraged to start benefiber to bulk up stools and keep food/stool journal to determine any specific cause. She was provided a list of magnesium forms that more commonly cause diarrhea. May consider initiation of cholestyramine if benefiber does not improve her symptoms.   PLAN:  TSH, pancreatic elastase, fecal fat  2.  Benefiber 1T BID-TID  3. Continue with probiotic 4. Consider cholestyramine if benefiber not improving symptoms 5. Food/stool journal  6. Be mindful of magnesium form worsening diarrhea   All questions were answered, patient verbalized understanding and is in agreement with plan as outlined above.   Follow Up: 3 months   Bianca Bertz L. Jeanmarie Hubert, MSN, APRN, AGNP-C Adult-Gerontology Nurse Practitioner [facility]  I have reviewed the note and agree with the APP's assessment as described in this progress note  Katrinka Blazing, MD Gastroenterology and Hepatology [facility]

## 2022-12-20 ENCOUNTER — Telehealth: Payer: Self-pay | Admitting: Diagnostic Neuroimaging

## 2022-12-20 ENCOUNTER — Ambulatory Visit (INDEPENDENT_AMBULATORY_CARE_PROVIDER_SITE_OTHER): Payer: Medicare Other | Admitting: Diagnostic Neuroimaging

## 2022-12-20 ENCOUNTER — Encounter: Payer: Self-pay | Admitting: Diagnostic Neuroimaging

## 2022-12-20 VITALS — BP 128/72 | HR 84 | Ht 65.0 in | Wt 186.0 lb

## 2022-12-20 DIAGNOSIS — R531 Weakness: Secondary | ICD-10-CM

## 2022-12-20 DIAGNOSIS — R269 Unspecified abnormalities of gait and mobility: Secondary | ICD-10-CM

## 2022-12-20 DIAGNOSIS — R799 Abnormal finding of blood chemistry, unspecified: Secondary | ICD-10-CM | POA: Diagnosis not present

## 2022-12-20 DIAGNOSIS — R251 Tremor, unspecified: Secondary | ICD-10-CM

## 2022-12-20 LAB — TSH: TSH: 4.05 m[IU]/L (ref 0.40–4.50)

## 2022-12-20 NOTE — Telephone Encounter (Signed)
medicare/[employer] sup NPR sent to [facility] [phone]

## 2022-12-20 NOTE — Progress Notes (Signed)
[facility]  PATIENT: Bianca Vasquez DOB: 1951-03-06  REFERRING CLINICIAN: Wendall Papa, NP HISTORY FROM: patient  REASON FOR VISIT: new consult   HISTORICAL  CHIEF COMPLAINT:  Chief Complaint  Patient presents with   New Patient (Initial Visit)    Pt alone, rm 6. If she leans over forward to far she falls. She states if she walks for any longer than 10-15 min she can feel herself getting tired and so she knows at that point she can't go any further. She states that she will tries to move forward and can she just moves slow and feels like she would fall. Denies dizziness just weakness and fatigue. She has a tremor mostly in the left hand. She related this to nerves at first but also can be watching tv and notice it.    HISTORY OF PRESENT ILLNESS:   72 year old ambidextrous female here for evaluation of gait difficulty, weakness spells.  Symptoms started around 2 years ago and have progressively worsened over the past year.  Sometimes when she is walking, for more than 50 minutes at a time she feels generalized weakness and balance issues.  Sometimes she feels her whole body go limp.  Has had some issues with left greater than right hip weakness and pain.  Has been having some intermittent tremors in her left greater than right arm.  No speech or swallowing difficulties.  No numbness or tingling.   REVIEW OF SYSTEMS: Full 14 system review of systems performed and negative with exception of: as per HPI.  ALLERGIES: Allergies  Allergen Reactions   Augmentin [Amoxicillin-Pot Clavulanate] Nausea And Vomiting    HOME MEDICATIONS: Outpatient Medications Prior to Visit  Medication Sig Dispense Refill   acidophilus (RISAQUAD) CAPS capsule Take 1 capsule by mouth daily.     amitriptyline (ELAVIL) 25 MG tablet Take 25 mg by mouth at bedtime.     Apoaequorin (PREVAGEN PO) Take by mouth daily at 6 (six) AM.     atorvastatin (LIPITOR) 10 MG tablet Take 10  mg by mouth daily.     Biotin 5000 MCG TABS Take 5,000 mcg by mouth daily.     buPROPion (WELLBUTRIN SR) 150 MG 12 hr tablet Take 150 mg by mouth 2 (two) times daily.     CRANBERRY PO Take 1 capsule by mouth daily.     cyanocobalamin (VITAMIN B12) 1000 MCG tablet Take 2,500 mcg by mouth daily.     estradiol (ESTRACE) 1 MG tablet Take 1 mg by mouth daily.     famotidine (PEPCID) 20 MG tablet Take 20 mg by mouth at bedtime.     levothyroxine (SYNTHROID) 100 MCG tablet Take 100 mcg by mouth daily before breakfast.     loratadine (CLARITIN) 10 MG tablet Take 10 mg by mouth daily.     MAGNESIUM PO Take 500 mg by mouth daily.     montelukast (SINGULAIR) 10 MG tablet Take 10 mg by mouth at bedtime.     pantoprazole (PROTONIX) 40 MG tablet TAKE 1 TABLET BY MOUTH DAILY 90 tablet 1   sertraline (ZOLOFT) 100 MG tablet Take 100 mg by mouth daily.     Wheat Dextrin (BENEFIBER) POWD Take 1 Scoop by mouth 3 (three) times daily as needed (bowel).     acetaminophen-codeine (TYLENOL #3) 300-30 MG tablet Take 1 tablet by mouth every 4 (four) hours as needed.     albuterol (ACCUNEB) 1.25 MG/3ML nebulizer solution Take 1 ampule by nebulization 3 (three) times daily  as needed.     albuterol (VENTOLIN HFA) 108 (90 Base) MCG/ACT inhaler Inhale 2 puffs into the lungs every 4 (four) hours as needed.     benzonatate (TESSALON) 200 MG capsule Take 200 mg by mouth 3 (three) times daily as needed for cough.     buPROPion (ZYBAN) 150 MG 12 hr tablet Take 150 mg by mouth daily.     sucralfate (CARAFATE) 1 GM/10ML suspension Take 2 g by mouth 2 (two) times daily.     No facility-administered medications prior to visit.    PAST MEDICAL HISTORY: Past Medical History:  Diagnosis Date   Anemia    Cancer (HCC)    colon   Cancer of skin of external nose    Depression    GERD (gastroesophageal reflux disease)    High cholesterol    Hypothyroidism     PAST SURGICAL HISTORY: Past Surgical History:  Procedure Laterality  Date   ABDOMINAL HYSTERECTOMY     total   BIOPSY  06/22/2020   Procedure: BIOPSY;  Surgeon: Dolores Frame, MD;  [facility]: [facility];  Service: Gastroenterology;;  duodenum   CHOLECYSTECTOMY     COLECTOMY  07/2020   COLONOSCOPY     COLONOSCOPY WITH PROPOFOL N/A 06/22/2020   Procedure: COLONOSCOPY WITH PROPOFOL;  Surgeon: Dolores Frame, MD;  Location: [facility];  Service: Gastroenterology;  Laterality: N/A;  am   COLONOSCOPY WITH PROPOFOL N/A 11/29/2021   Procedure: COLONOSCOPY WITH PROPOFOL;  Surgeon: Dolores Frame, MD;  Location: [facility];  Service: Gastroenterology;  Laterality: N/A;  915 ASA 2   ESOPHAGOGASTRODUODENOSCOPY (EGD) WITH PROPOFOL N/A 06/22/2020   Procedure: ESOPHAGOGASTRODUODENOSCOPY (EGD) WITH PROPOFOL;  Surgeon: Dolores Frame, MD;  Location: [facility];  Service: Gastroenterology;  Laterality: N/A;   POLYPECTOMY  06/22/2020   Procedure: POLYPECTOMY;  Surgeon: Marguerita Merles, Reuel Boom, MD;  Location: [facility];  Service: Gastroenterology;;  gastric   POLYPECTOMY  06/22/2020   Procedure: POLYPECTOMY INTESTINAL;  Surgeon: Dolores Frame, MD;  Location: [facility];  Service: Gastroenterology;;  colon    FAMILY HISTORY: Family History  Problem Relation Age of Onset   COPD Mother    COPD Father    Heart disease Sister    Kidney disease Sister    Fibromyalgia Sister    Migraines Sister    Emphysema Sister    Healthy Brother    Cancer - Lung Maternal Aunt    Cancer Maternal Uncle     SOCIAL HISTORY: Social History   Socioeconomic History   Marital status: Married    Spouse name: Not on file   Number of children: Not on file   Years of education: Not on file   Highest education level: Not on file  Occupational History   Not on file  Tobacco Use   Smoking status: Former    Current packs/day: 0.00    Types: Cigarettes    Quit date: 1989    Years since quitting: 35.6    Passive  exposure: Never   Smokeless tobacco: Never   Tobacco comments:    Quit 35 years ago  Vaping Use   Vaping status: Never Used  Substance and Sexual Activity   Alcohol use: Never   Drug use: Never   Sexual activity: Not on file  Other Topics Concern   Not on file  Social History Narrative   Not on file   Social Determinants of Health   Financial Resource Strain: Low Risk  (  08/10/2020)   Received from [facility]   Overall Financial Resource Strain (CARDIA)    Difficulty of Paying Living Expenses: Not hard at all  Food Insecurity: No Food Insecurity (08/23/2021)   Received from [facility], [facility]   Hunger Vital Sign    Worried About Running Out of Food in the Last Year: Never true    Ran Out of Food in the Last Year: Never true  Transportation Needs: Not on file  Physical Activity: Not on file  Stress: No Stress Concern Present (08/10/2020)   Received from [facility], [facility] - Occupational Stress Questionnaire    Feeling of Stress : Not at all  Social Connections: Unknown (08/21/2021)   Received from [facility], [facility]   Social Network    Social Network: Not on file  Intimate Partner Violence: Not At Risk (02/20/2022)   Received from [facility], [facility]   Humiliation, Afraid, Rape, and Kick questionnaire    Fear of Current or Ex-Partner: No    Emotionally Abused: No    Physically Abused: No    Sexually Abused: No     PHYSICAL EXAM  GENERAL EXAM/CONSTITUTIONAL: Vitals:  Vitals:   12/20/22 1005  BP: 128/72  Pulse: 84  Weight: 186 lb (84.4 kg)  Height: 5\' 5"  (1.651 m)   Body mass index is 30.95 kg/m. Wt Readings from Last 3 Encounters:  12/20/22 186 lb (84.4 kg)  12/19/22 186 lb 8 oz (84.6 kg)  02/06/22 188 lb 3.2 oz (85.4 kg)   Patient is in no distress; well developed, nourished and groomed; neck is supple  CARDIOVASCULAR: Examination of carotid arteries is  normal; no carotid bruits Regular rate and rhythm, no murmurs Examination of peripheral vascular system by observation and palpation is normal  EYES: Ophthalmoscopic exam of optic discs and posterior segments is normal; no papilledema or hemorrhages No results found.  MUSCULOSKELETAL: Gait, strength, tone, movements noted in Neurologic exam below  NEUROLOGIC: MENTAL STATUS:      No data to display         awake, alert, oriented to person, place and time recent and remote memory intact normal attention and concentration language fluent, comprehension intact, naming intact fund of knowledge appropriate  CRANIAL NERVE:  2nd - no papilledema on fundoscopic exam 2nd, 3rd, 4th, 6th - pupils equal and reactive to light, visual fields full to confrontation, extraocular muscles intact, no nystagmus 5th - facial sensation symmetric 7th - facial strength symmetric 8th - hearing intact 9th - palate elevates symmetrically, uvula midline 11th - shoulder shrug symmetric 12th - tongue protrusion midline MILD MASKED FACIES  MOTOR:  normal bulk and tone, full strength in the BUE, BLE SUBTLE COGWHEELING IN BUE R > L WITH CONTRALATERAL REINFORCEMENT MILD BRADYKINESIA IN RUE AND LLE MILD ACTION > POSTURAL TREMOR NO RESTING TREMOR  SENSORY:  normal and symmetric to light touch, temperature, vibration  COORDINATION:  finger-nose-finger, fine finger movements normal  REFLEXES:  deep tendon reflexes 1+ and symmetric  GAIT/STATION:  narrow based gait; SLIGHTLY STIFF GAIT; DECR ARM SWING     DIAGNOSTIC DATA (LABS, IMAGING, TESTING) - I reviewed patient records, labs, notes, testing and imaging myself where available.  Lab Results  Component Value Date   WBC 11.6 (H) 11/01/2021   HGB 12.9 11/01/2021   HCT 38.0 11/01/2021   MCV 87.5 11/01/2021   PLT 387.0 11/01/2021      Component Value Date/Time  NA 137 11/01/2021 1158   K 4.4 11/01/2021 1158   CL 102 11/01/2021 1158    CO2 28 11/01/2021 1158   GLUCOSE 106 (H) 11/01/2021 1158   BUN 14 11/01/2021 1158   CREATININE 0.79 11/01/2021 1158   CALCIUM 9.7 11/01/2021 1158   No results found for: "CHOL", "HDL", "LDLCALC", "LDLDIRECT", "TRIG", "CHOLHDL" No results found for: "HGBA1C" No results found for: "VITAMINB12" Lab Results  Component Value Date   TSH 4.05 12/19/2022    05/08/17 MRI lumbar spine 1.  Mild levocurvature of the lumbar spine.  2.  Multilevel DDD and facet arthrosis. No significant spinal canal stenosis. Of note, there is a left foraminal disc extrusion at L1-2 that impinges on the exiting left L1 nerve root and could account for left-sided radicular symptoms     ASSESSMENT AND PLAN  72 y.o. year old female here with intermittent and progressive gait and balance difficulty, spells of weakness, exercise intolerance, postural and action tremor since 2022.  Will proceed with further workup to rule out neuromuscular causes, central nervous system causes, and also subtle parkinsonism evaluation.   Dx:  1. Weakness   2. Gait difficulty   3. Tremor   4. Abnormal finding of blood chemistry, unspecified      PLAN:  GAIT DIFF / EXERCISE INTOLERANCE / TREMOR - check labs (NMJ dz, myopathy) - check MRI brain (stroke, autoimmune, inflamm, NPH eval) - check DATscan (essential tremor vs parkinsonism eval) - follow up PCP; consider cardiology eval for exercise intolerance  Orders Placed This Encounter  Procedures   MR BRAIN W WO CONTRAST   NM BRAIN DATSCAN TUMOR LOC INFLAM SPECT 1 DAY   AChR Abs with Reflex to MuSK   Vitamin B12   Hemoglobin A1c   CK   Aldolase   Return for pending if symptoms worsen or fail to improve, pending test results.    Suanne Marker, MD 12/20/2022, 11:02 AM Certified in Neurology, Neurophysiology and Neuroimaging  [facility] [address] [place], [place] [address] [phone]

## 2022-12-20 NOTE — Telephone Encounter (Signed)
medicare/[employer] sup NPR sent to GI [phone] 

## 2022-12-23 ENCOUNTER — Observation Stay (HOSPITAL_COMMUNITY)
Admission: EM | Admit: 2022-12-23 | Discharge: 2022-12-24 | Disposition: A | Discharge status: Home / Self Care | Payer: Medicare Other | Attending: Internal Medicine | Admitting: Internal Medicine

## 2022-12-23 ENCOUNTER — Emergency Department (HOSPITAL_COMMUNITY): Payer: Medicare Other

## 2022-12-23 ENCOUNTER — Other Ambulatory Visit: Payer: Self-pay

## 2022-12-23 ENCOUNTER — Encounter (HOSPITAL_COMMUNITY): Payer: Self-pay

## 2022-12-23 DIAGNOSIS — E039 Hypothyroidism, unspecified: Secondary | ICD-10-CM | POA: Insufficient documentation

## 2022-12-23 DIAGNOSIS — Z79899 Other long term (current) drug therapy: Secondary | ICD-10-CM | POA: Diagnosis not present

## 2022-12-23 DIAGNOSIS — Z85828 Personal history of other malignant neoplasm of skin: Secondary | ICD-10-CM | POA: Insufficient documentation

## 2022-12-23 DIAGNOSIS — I2699 Other pulmonary embolism without acute cor pulmonale: Secondary | ICD-10-CM | POA: Diagnosis not present

## 2022-12-23 DIAGNOSIS — I2510 Atherosclerotic heart disease of native coronary artery without angina pectoris: Secondary | ICD-10-CM | POA: Diagnosis not present

## 2022-12-23 DIAGNOSIS — M79604 Pain in right leg: Secondary | ICD-10-CM | POA: Diagnosis not present

## 2022-12-23 DIAGNOSIS — I82451 Acute embolism and thrombosis of right peroneal vein: Secondary | ICD-10-CM | POA: Diagnosis not present

## 2022-12-23 DIAGNOSIS — I824Y1 Acute embolism and thrombosis of unspecified deep veins of right proximal lower extremity: Secondary | ICD-10-CM

## 2022-12-23 DIAGNOSIS — I82431 Acute embolism and thrombosis of right popliteal vein: Secondary | ICD-10-CM | POA: Diagnosis not present

## 2022-12-23 DIAGNOSIS — Z6831 Body mass index (BMI) 31.0-31.9, adult: Secondary | ICD-10-CM | POA: Insufficient documentation

## 2022-12-23 DIAGNOSIS — I82411 Acute embolism and thrombosis of right femoral vein: Secondary | ICD-10-CM | POA: Diagnosis not present

## 2022-12-23 DIAGNOSIS — M7989 Other specified soft tissue disorders: Secondary | ICD-10-CM | POA: Diagnosis present

## 2022-12-23 DIAGNOSIS — Z85038 Personal history of other malignant neoplasm of large intestine: Secondary | ICD-10-CM | POA: Diagnosis not present

## 2022-12-23 DIAGNOSIS — I2693 Single subsegmental pulmonary embolism without acute cor pulmonale: Secondary | ICD-10-CM | POA: Diagnosis not present

## 2022-12-23 DIAGNOSIS — E669 Obesity, unspecified: Secondary | ICD-10-CM | POA: Diagnosis not present

## 2022-12-23 DIAGNOSIS — I5032 Chronic diastolic (congestive) heart failure: Secondary | ICD-10-CM | POA: Diagnosis not present

## 2022-12-23 DIAGNOSIS — K219 Gastro-esophageal reflux disease without esophagitis: Secondary | ICD-10-CM | POA: Diagnosis present

## 2022-12-23 DIAGNOSIS — R079 Chest pain, unspecified: Secondary | ICD-10-CM | POA: Diagnosis not present

## 2022-12-23 DIAGNOSIS — I82401 Acute embolism and thrombosis of unspecified deep veins of right lower extremity: Secondary | ICD-10-CM | POA: Insufficient documentation

## 2022-12-23 DIAGNOSIS — Z87891 Personal history of nicotine dependence: Secondary | ICD-10-CM | POA: Diagnosis not present

## 2022-12-23 DIAGNOSIS — I82409 Acute embolism and thrombosis of unspecified deep veins of unspecified lower extremity: Secondary | ICD-10-CM | POA: Insufficient documentation

## 2022-12-23 LAB — COMPREHENSIVE METABOLIC PANEL
ALT: 14 U/L (ref 0–44)
AST: 15 U/L (ref 15–41)
Albumin: 3.3 g/dL — ABNORMAL LOW (ref 3.5–5.0)
Alkaline Phosphatase: 89 U/L (ref 38–126)
Anion gap: 8 (ref 5–15)
BUN: 9 mg/dL (ref 8–23)
CO2: 25 mmol/L (ref 22–32)
Calcium: 8.3 mg/dL — ABNORMAL LOW (ref 8.9–10.3)
Chloride: 102 mmol/L (ref 98–111)
Creatinine, Ser: 0.69 mg/dL (ref 0.44–1.00)
GFR, Estimated: >60 mL/min (ref >60)
Glucose, Bld: 120 mg/dL — ABNORMAL HIGH (ref 70–99)
Potassium: 3.9 mmol/L (ref 3.5–5.1)
Sodium: 135 mmol/L (ref 135–145)
Total Bilirubin: 0.4 mg/dL (ref 0.3–1.2)
Total Protein: 7 g/dL (ref 6.5–8.1)

## 2022-12-23 LAB — TROPONIN I (HIGH SENSITIVITY)
Troponin I (High Sensitivity): 5 ng/L (ref <18)
Troponin I (High Sensitivity): 6 ng/L (ref <18)

## 2022-12-23 LAB — CBC
HCT: 31.5 % — ABNORMAL LOW (ref 36.0–46.0)
Hemoglobin: 10.1 g/dL — ABNORMAL LOW (ref 12.0–15.0)
MCH: 27.5 pg (ref 26.0–34.0)
MCHC: 32.1 g/dL (ref 30.0–36.0)
MCV: 85.8 fL (ref 80.0–100.0)
Platelets: 248 10*3/uL (ref 150–400)
RBC: 3.67 MIL/uL — ABNORMAL LOW (ref 3.87–5.11)
RDW: 12.7 % (ref 11.5–15.5)
WBC: 9.7 10*3/uL (ref 4.0–10.5)
nRBC: 0 % (ref 0.0–0.2)

## 2022-12-23 LAB — BRAIN NATRIURETIC PEPTIDE: B Natriuretic Peptide: 42 pg/mL (ref 0.0–100.0)

## 2022-12-23 MED ORDER — ACETAMINOPHEN 325 MG PO TABS: 650.0000 mg | ORAL_TABLET | Freq: Four times a day (QID) | ORAL | Status: DC | PRN

## 2022-12-23 MED ORDER — OXYCODONE HCL 5 MG PO TABS: 5.0000 mg | ORAL_TABLET | ORAL | Status: DC | PRN

## 2022-12-23 MED ORDER — BENEFIBER PO POWD: 1.0000 | Freq: Two times a day (BID) | ORAL | Status: DC

## 2022-12-23 MED ORDER — ACETAMINOPHEN 650 MG RE SUPP: 650.0000 mg | Freq: Four times a day (QID) | RECTAL | Status: DC | PRN

## 2022-12-23 MED ORDER — LORATADINE 10 MG PO TABS
10.0000 mg | ORAL_TABLET | Freq: Every day | ORAL | Status: DC
  Administered 2022-12-24: 10 mg via ORAL
  Filled 2022-12-23 (×2): qty 1

## 2022-12-23 MED ORDER — IOHEXOL 350 MG/ML SOLN
75.0000 mL | Freq: Once | INTRAVENOUS | Status: AC | PRN
  Administered 2022-12-23: 75 mL via INTRAVENOUS

## 2022-12-23 MED ORDER — BUPROPION HCL ER (SR) 150 MG PO TB12
150.0000 mg | ORAL_TABLET | Freq: Two times a day (BID) | ORAL | Status: DC
  Administered 2022-12-23 – 2022-12-24 (×2): 150 mg via ORAL
  Filled 2022-12-23 (×2): qty 1

## 2022-12-23 MED ORDER — MONTELUKAST SODIUM 10 MG PO TABS
10.0000 mg | ORAL_TABLET | Freq: Every day | ORAL | Status: DC
  Administered 2022-12-23: 10 mg via ORAL
  Filled 2022-12-23: qty 1

## 2022-12-23 MED ORDER — AMITRIPTYLINE HCL 25 MG PO TABS
25.0000 mg | ORAL_TABLET | Freq: Every day | ORAL | Status: DC
  Administered 2022-12-23: 25 mg via ORAL
  Filled 2022-12-23: qty 1

## 2022-12-23 MED ORDER — PANTOPRAZOLE SODIUM 40 MG PO TBEC
40.0000 mg | DELAYED_RELEASE_TABLET | Freq: Every day | ORAL | Status: DC
  Administered 2022-12-24: 40 mg via ORAL
  Filled 2022-12-23: qty 1

## 2022-12-23 MED ORDER — VITAMIN B-12 1000 MCG PO TABS
2500.0000 ug | ORAL_TABLET | Freq: Every day | ORAL | Status: DC
  Administered 2022-12-23 – 2022-12-24 (×2): 2500 ug via ORAL
  Filled 2022-12-23: qty 5
  Filled 2022-12-23: qty 3

## 2022-12-23 MED ORDER — HEPARIN (PORCINE) 25000 UT/250ML-% IV SOLN
1350.0000 [IU]/h | INTRAVENOUS | Status: DC
  Administered 2022-12-23: 1200 [IU]/h via INTRAVENOUS
  Administered 2022-12-24: 1350 [IU]/h via INTRAVENOUS
  Filled 2022-12-23 (×2): qty 250

## 2022-12-23 MED ORDER — LEVOTHYROXINE SODIUM 100 MCG PO TABS
100.0000 ug | ORAL_TABLET | Freq: Every day | ORAL | Status: DC
  Administered 2022-12-24: 100 ug via ORAL
  Filled 2022-12-23: qty 1

## 2022-12-23 MED ORDER — FAMOTIDINE 20 MG PO TABS
20.0000 mg | ORAL_TABLET | Freq: Every day | ORAL | Status: DC
  Administered 2022-12-23: 20 mg via ORAL
  Filled 2022-12-23: qty 1

## 2022-12-23 MED ORDER — HEPARIN BOLUS VIA INFUSION
5000.0000 [IU] | Freq: Once | INTRAVENOUS | Status: AC
  Administered 2022-12-23: 5000 [IU] via INTRAVENOUS

## 2022-12-23 MED ORDER — SERTRALINE HCL 50 MG PO TABS
100.0000 mg | ORAL_TABLET | Freq: Every day | ORAL | Status: DC
  Administered 2022-12-23 – 2022-12-24 (×2): 100 mg via ORAL
  Filled 2022-12-23 (×2): qty 2

## 2022-12-23 MED ORDER — ATORVASTATIN CALCIUM 10 MG PO TABS
10.0000 mg | ORAL_TABLET | Freq: Every day | ORAL | Status: DC
  Administered 2022-12-23 – 2022-12-24 (×2): 10 mg via ORAL
  Filled 2022-12-23 (×3): qty 1

## 2022-12-23 NOTE — H&P (Signed)
TRH H&P   Patient Demographics:    Bianca Vasquez, is a 72 y.o. female  MRN: [id number]   DOB - 10/26/1950  Admit Date - 12/23/2022  Outpatient Primary MD for the patient is Wendall Papa  Referring MD/NP/PA: PA Cooper  Patient coming from: home  Chief Complaint  Patient presents with   Leg Swelling      HPI:    Bianca Vasquez  is a 72 y.o. female, past medical history of colon cancer, s/p resection, GERD, hyperlipidemia, hypothyroidism. -Patient presents to ED secondary to complaints of leg swelling, reports leg pain for last 2 days, and she reports long distance driving x 3 hours yesterday, for which she developed after significant right lower extremity edema, and pain, was on estrogen therapy secondary to recurrent UTIs (per patient), denies fever, chills, chest pain or dyspnea -In ED venous Doppler significant for occlusive DVT in right femoral, popliteal and peroneal veins, CTA chest significant for with mild segmental right lower lobe PE, patient was started on heparin GTT, and [facility] hospitalist consulted to admit.    Review of systems:     A full 10 point Review of Systems was done, except as stated above, all other Review of Systems were negative.   With Past History of the following :    Past Medical History:  Diagnosis Date   Anemia    Cancer (HCC)    colon   Cancer of skin of external nose    Depression    GERD (gastroesophageal reflux disease)    High cholesterol    Hypothyroidism       Past Surgical History:  Procedure Laterality Date   ABDOMINAL HYSTERECTOMY     total   BIOPSY  06/22/2020   Procedure: BIOPSY;  Surgeon: Dolores Frame, MD;  Location: [facility];  Service: Gastroenterology;;  duodenum   CHOLECYSTECTOMY     COLECTOMY  07/2020   COLONOSCOPY     COLONOSCOPY WITH PROPOFOL N/A 06/22/2020   Procedure: COLONOSCOPY WITH  PROPOFOL;  Surgeon: Dolores Frame, MD;  Location: [facility];  Service: Gastroenterology;  Laterality: N/A;  am   COLONOSCOPY WITH PROPOFOL N/A 11/29/2021   Procedure: COLONOSCOPY WITH PROPOFOL;  Surgeon: Dolores Frame, MD;  Location: [facility];  Service: Gastroenterology;  Laterality: N/A;  915 ASA 2   ESOPHAGOGASTRODUODENOSCOPY (EGD) WITH PROPOFOL N/A 06/22/2020   Procedure: ESOPHAGOGASTRODUODENOSCOPY (EGD) WITH PROPOFOL;  Surgeon: Dolores Frame, MD;  Location: [facility];  Service: Gastroenterology;  Laterality: N/A;   POLYPECTOMY  06/22/2020   Procedure: POLYPECTOMY;  Surgeon: Dolores Frame, MD;  Location: [facility];  Service: Gastroenterology;;  gastric   POLYPECTOMY  06/22/2020   Procedure: POLYPECTOMY INTESTINAL;  Surgeon: Dolores Frame, MD;  Location: [facility];  Service: Gastroenterology;;  colon      Social History:     Social History  Tobacco Use   Smoking status: Former    Current packs/day: 0.00    Types: Cigarettes    Quit date: 1989    Years since quitting: 35.6    Passive exposure: Never   Smokeless tobacco: Never   Tobacco comments:    Quit 35 years ago  Substance Use Topics   Alcohol use: Never       Family History :     Family History  Problem Relation Age of Onset   COPD Mother    COPD Father    Heart disease Sister    Kidney disease Sister    Fibromyalgia Sister    Migraines Sister    Emphysema Sister    Healthy Brother    Cancer - Lung Maternal Aunt    Cancer Maternal Uncle      Home Medications:   Prior to Admission medications   Medication Sig Start Date End Date Taking? Authorizing Provider  amitriptyline (ELAVIL) 25 MG tablet Take 25 mg by mouth at bedtime.   Yes [provider]  Apoaequorin 10 MG CAPS Take 10 mg by mouth daily at 6 (six) AM.   Yes [provider]  atorvastatin (LIPITOR) 10 MG tablet Take 10 mg by mouth daily.   Yes [provider]  Biotin 5000 MCG TABS Take 5,000 mcg by mouth daily.   Yes [provider]  buPROPion (WELLBUTRIN SR) 150 MG 12 hr tablet Take 150 mg by mouth 2 (two) times daily.   Yes [provider]  CRANBERRY PO Take 1 capsule by mouth daily.   Yes [provider]  cyanocobalamin (VITAMIN B12) 1000 MCG tablet Take 2,500 mcg by mouth daily.   Yes [provider]  famotidine (PEPCID) 20 MG tablet Take 20 mg by mouth at bedtime.   Yes [provider]  levothyroxine (SYNTHROID) 100 MCG tablet Take 100 mcg by mouth daily before breakfast.   Yes [provider]  loratadine (CLARITIN) 10 MG tablet Take 10 mg by mouth daily.   Yes [provider]  magnesium oxide (MAG-OX) 400 (240 Mg) MG tablet Take 400 mg by mouth daily.   Yes [provider]  montelukast (SINGULAIR) 10 MG tablet Take 10 mg by mouth at bedtime.   Yes [provider]  pantoprazole (PROTONIX) 40 MG tablet TAKE 1 TABLET BY MOUTH DAILY 07/24/22  Yes Nyoka Cowden, MD  sertraline (ZOLOFT) 100 MG tablet Take 100 mg by mouth daily.   Yes [provider]  Wheat Dextrin (BENEFIBER) POWD Take 1 Scoop by mouth in the morning and at bedtime.   Yes [provider]     Allergies:     Allergies  Allergen Reactions   Augmentin [Amoxicillin-Pot Clavulanate] Nausea And Vomiting   Amoxicillin Nausea And Vomiting and Other (See Comments)     Physical Exam:   Vitals  Blood pressure (!) 140/70, pulse 80, temperature 98.6 F (37 C), temperature source Oral, resp. rate 18, height 5\' 5"  (1.651 m), weight 83.9 kg, SpO2 98%.   1. General well developed female  lying in bed in NAD,    2. Normal affect and insight, Not Suicidal or Homicidal, Awake Alert, Oriented X 3.  3. No F.N deficits, ALL C.Nerves Intact, Strength 5/5 all 4 extremities, Sensation intact all 4 extremities, Plantars down going.  4. Ears and Eyes appear Normal, Conjunctivae  clear, PERRLA. Moist Oral Mucosa.  5. Supple Neck, No JVD, No cervical lymphadenopathy appriciated, No Carotid Bruits.  6.  Symmetrical Chest wall movement, Good air movement bilaterally, CTAB.  7. RRR, No Gallops, Rubs or Murmurs, No Parasternal Heave.  8. Positive Bowel Sounds, Abdomen Soft, No tenderness, No organomegaly appriciated,No rebound -guarding or rigidity.  9.  No Cyanosis, Normal Skin Turgor, No Skin Rash or Bruise, has right lower ext edema.      Data Review:    CBC Recent Labs  Lab 12/23/22 1231  WBC 9.7  HGB 10.1*  HCT 31.5*  PLT 248  MCV 85.8  MCH 27.5  MCHC 32.1  RDW 12.7   ------------------------------------------------------------------------------------------------------------------  Chemistries  Recent Labs  Lab 12/23/22 1231  NA 135  K 3.9  CL 102  CO2 25  GLUCOSE 120*  BUN 9  CREATININE 0.69  CALCIUM 8.3*  AST 15  ALT 14  ALKPHOS 89  BILITOT 0.4   ------------------------------------------------------------------------------------------------------------------ estimated creatinine clearance is 66 mL/min (by C-G formula based on SCr of 0.69 mg/dL). ------------------------------------------------------------------------------------------------------------------ No results for input(s): "TSH", "T4TOTAL", "T3FREE", "THYROIDAB" in the last 72 hours.  Invalid input(s): "FREET3"  Coagulation profile No results for input(s): "INR", "PROTIME" in the last 168 hours. ------------------------------------------------------------------------------------------------------------------- No results for input(s): "DDIMER" in the last 72 hours. -------------------------------------------------------------------------------------------------------------------  Cardiac Enzymes No results for input(s): "CKMB", "TROPONINI", "MYOGLOBIN" in the last 168 hours.  Invalid input(s):  "CK" ------------------------------------------------------------------------------------------------------------------ No results found for: "BNP"   ---------------------------------------------------------------------------------------------------------------  Urinalysis No results found for: "COLORURINE", "APPEARANCEUR", "LABSPEC", "PHURINE", "GLUCOSEU", "HGBUR", "BILIRUBINUR", "KETONESUR", "PROTEINUR", "UROBILINOGEN", "NITRITE", "LEUKOCYTESUR"  ----------------------------------------------------------------------------------------------------------------   Imaging Results:    CT Angio Chest PE W/Cm &/Or Wo Cm  Result Date: 12/23/2022 CLINICAL DATA:  Right lower extremity DVT on earlier ultrasound, pain with ambulation EXAM: CT ANGIOGRAPHY CHEST WITH CONTRAST TECHNIQUE: Multidetector CT imaging of the chest was performed using the standard protocol during bolus administration of intravenous contrast. Multiplanar CT image reconstructions and MIPs were obtained to evaluate the vascular anatomy. RADIATION DOSE REDUCTION: This exam was performed according to the departmental dose-optimization program which includes automated exposure control, adjustment of the mA and/or kV according to patient size and/or use of iterative reconstruction technique. CONTRAST:  75mL OMNIPAQUE IOHEXOL 350 MG/ML SOLN COMPARISON:  06/28/2020 FINDINGS: Cardiovascular: This is a technically adequate evaluation of the pulmonary vasculature. There are small right lower lobe segmental pulmonary emboli. Minimal clot burden. No evidence of right heart strain. The heart is unremarkable without pericardial effusion. No evidence of thoracic aortic aneurysm or dissection. Atherosclerosis of the aorta and coronary vasculature. Mediastinum/Nodes: No enlarged mediastinal, hilar, or axillary lymph nodes. Thyroid gland, trachea, and esophagus demonstrate no significant findings. Small hiatal hernia. Lungs/Pleura: No acute airspace  disease, effusion, or pneumothorax. Central airways are patent. Upper Abdomen: Hepatic steatosis. No acute upper abdominal findings. Musculoskeletal: No acute or destructive bony abnormalities. Reconstructed images demonstrate no additional findings. Review of the MIP images confirms the above findings. IMPRESSION: 1. Small segmental right lower lobe pulmonary emboli in this patient with a known history of right lower extremity DVT. Minimal clot burden, with no evidence of right heart strain. 2. Aortic Atherosclerosis (ICD10-I70.0). Coronary artery atherosclerosis. 3. Hepatic steatosis. Critical Value/emergent results were called by telephone at the time of interpretation on 12/23/2022 at 6:28 pm to provider COOPER ROBBINS , who verbally acknowledged these results. Electronically Signed   By: Sharlet Salina M.D.   On: 12/23/2022 18:40   US Venous Img Lower Right (DVT Study)  Result Date: 12/23/2022 CLINICAL DATA:  Right lower extremity swelling and pain, erythema EXAM: RIGHT LOWER EXTREMITY VENOUS DOPPLER ULTRASOUND TECHNIQUE: Gray-scale  sonography with graded compression, as well as color Doppler and duplex ultrasound were performed to evaluate the lower extremity deep venous systems from the level of the common femoral vein and including the common femoral, femoral, profunda femoral, popliteal and calf veins including the posterior tibial, peroneal and gastrocnemius veins when visible. The superficial great saphenous vein was also interrogated. Spectral Doppler was utilized to evaluate flow at rest and with distal augmentation maneuvers in the common femoral, femoral and popliteal veins. COMPARISON:  None Available. FINDINGS: Contralateral Common Femoral Vein: Respiratory phasicity is normal and symmetric with the symptomatic side. No evidence of thrombus. Normal compressibility. Common Femoral Vein: No evidence of thrombus. Normal compressibility, respiratory phasicity and response to augmentation.  Saphenofemoral Junction: No evidence of thrombus. Normal compressibility and flow on color Doppler imaging. Profunda Femoral Vein: No evidence of thrombus. Normal compressibility and flow on color Doppler imaging. 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 There is occlusive thrombus within 1 of the femoral veins, extending throughout the entire course involving the popliteal vein. This vessel is noncompressible with no internal Doppler flow. The other femoral vein appears patent. Popliteal Vein: Occlusive thrombus throughout the popliteal vein. The vessel is noncompressible with no color flow detected. Calf Veins: There is occlusive thrombus within the right peroneal vein, which is noncompressible. The posterior tibial vein appears patent. Superficial Great Saphenous Vein: No evidence of thrombus. Normal compressibility. Other Findings:  None. IMPRESSION: 1. Occlusive deep venous thrombosis involving the right femoral vein, popliteal vein, and peroneal vein as above. These results will be called to the ordering clinician or representative by the Radiologist Assistant, and communication documented in the PACS or [employer]. Electronically Signed   By: Sharlet Salina M.D.   On: 12/23/2022 16:43     EKG:  Pending   Assessment & Plan:    Principal Problem:   Pulmonary embolism (HCC) Active Problems:   GERD (gastroesophageal reflux disease)   Acute DVT (deep venous thrombosis) (HCC)   Acute Pulm Embolism Acute right lower ext DVT -Likely provoked DVT/PE, secondary to long driving, and estradiol use. -Will be admitted overnight for heparin GTT, telemetry monitoring, will obtain 2D echo with right heart strain. -Remains stable, likely can DC tomorrow Eliquis starter pack. -she was instructed to stop to stop her estradiol. -Have discussed with Dr. Randie Heinz from vascular regarding thrombectomy, occlusive DVT, but it does not involve common femoral vein, so no indication for  thrombectomy currently, she can follow-up with vascular surgery as an outpatient  GERD - continue with PPI  Synthroid  - continue with Syncthroid  Depression -Continue with home medications  Hyperlipidemia - continue with statin  History of colon cancer -S/p resection, she is regular colonoscopy, most recent last year, which was clean.  DVT Prophylaxis Heparin gtt  AM Labs Ordered, also please review Full Orders  Family Communication: Admission, patients condition and plan of care including tests being ordered have been discussed with the patient and Husband at bedside who indicate understanding and agree with the plan and Code Status.  Code Status Full  Likely DC to  home  Condition GUARDED    Consults called: none    Admission status: observation    Time spent in minutes : 60 minute   Huey Bienenstock M.D on 12/23/2022 at 7:24 PM   [facility] - Office  [phone]

## 2022-12-23 NOTE — Progress Notes (Signed)
ANTICOAGULATION CONSULT NOTE - Initial Consult  Pharmacy Consult for Heparin Indication: pulmonary embolus and DVT RLE  Allergies  Allergen Reactions   Augmentin [Amoxicillin-Pot Clavulanate] Nausea And Vomiting   Amoxicillin Nausea And Vomiting and Other (See Comments)    Patient Measurements: Height: 5\' 5"  (165.1 cm) Weight: 83.9 kg (185 lb) IBW/kg (Calculated) : 57 Heparin Dosing Weight: 75 kg  Vital Signs: Temp: 98.6 F (37 C) (08/31 1736) Temp Source: Oral (08/31 1736) BP: 140/70 (08/31 1736) Pulse Rate: 80 (08/31 1736)  Labs: Recent Labs    12/23/22 1231  HGB 10.1*  HCT 31.5*  PLT 248  CREATININE 0.69    Estimated Creatinine Clearance: 66 mL/min (by C-G formula based on SCr of 0.69 mg/dL).   Medical History: Past Medical History:  Diagnosis Date   Anemia    Cancer (HCC)    colon   Cancer of skin of external nose    Depression    GERD (gastroesophageal reflux disease)    High cholesterol    Hypothyroidism    Assessment:  72 yr old female to begin IV heparin for PE and RLE DVT.  Not on anticoagulation prior to admit.  Goal of Therapy:  Heparin level 0.3-0.7 units/ml Monitor platelets by anticoagulation protocol: Yes   Plan:  Heparin 5000 units IV bolus. Heparin drip to begin at 1200 units/hr Heparin level ~8 hrs after drip begins. Daily heparin level and CBC while on heparin. Monitor for signs/symptoms of bleeding.  Dennie Fetters, [place] 12/23/2022,6:45 PM

## 2022-12-23 NOTE — ED Triage Notes (Signed)
RIGHT calf and ankle swelling after sitting in car. Pain 8/10 while walking.   RIGHT leg warmer than left

## 2022-12-23 NOTE — ED Provider Notes (Signed)
[place] EMERGENCY DEPARTMENT AT [facility] Provider Note   CSN: [id number] Arrival date & time: 12/23/22  1032     History {Add pertinent medical, surgical, social history, OB history to HPI:1} Chief Complaint  Patient presents with   Leg Swelling    Bianca Vasquez is a 72 y.o. female.  HPI   72 year old female presents emergency department complaints of right lower extremity swelling.  Patient states that she has had right calf as well as ankle swelling for the past 3 days.  Denies history of similar symptoms in the past.  States that she did experience 3-hour car ride traveling prior to symptom onset.  Also with history of colorectal cancer as well as currently on estrogen therapy.  Denies any fever, chills, chest pain, shortness of breath, known coagulopathy, history of DVT/PE.  Denies any injury to leg.  States that she is having some pain in her right calf.  Past medical history significant for GERD, cancer, hypothyroidism, hypercholesterolemia, anemia  Home Medications Prior to Admission medications   Medication Sig Start Date End Date Taking? Authorizing Provider  acidophilus (RISAQUAD) CAPS capsule Take 1 capsule by mouth daily.    [provider]  amitriptyline (ELAVIL) 25 MG tablet Take 25 mg by mouth at bedtime.    [provider]  Apoaequorin (PREVAGEN PO) Take by mouth daily at 6 (six) AM.    [provider]  atorvastatin (LIPITOR) 10 MG tablet Take 10 mg by mouth daily.    [provider]  Biotin 5000 MCG TABS Take 5,000 mcg by mouth daily.    [provider]  buPROPion (WELLBUTRIN SR) 150 MG 12 hr tablet Take 150 mg by mouth 2 (two) times daily.    [provider]  CRANBERRY PO Take 1 capsule by mouth daily.    [provider]  cyanocobalamin (VITAMIN B12) 1000 MCG tablet Take 2,500 mcg by mouth daily.    [provider]  estradiol (ESTRACE) 1 MG tablet Take 1 mg by mouth  daily.    [provider]  famotidine (PEPCID) 20 MG tablet Take 20 mg by mouth at bedtime.    [provider]  levothyroxine (SYNTHROID) 100 MCG tablet Take 100 mcg by mouth daily before breakfast.    [provider]  loratadine (CLARITIN) 10 MG tablet Take 10 mg by mouth daily.    [provider]  MAGNESIUM PO Take 500 mg by mouth daily.    [provider]  montelukast (SINGULAIR) 10 MG tablet Take 10 mg by mouth at bedtime.    [provider]  pantoprazole (PROTONIX) 40 MG tablet TAKE 1 TABLET BY MOUTH DAILY 07/24/22   Nyoka Cowden, MD  sertraline (ZOLOFT) 100 MG tablet Take 100 mg by mouth daily.    [provider]  Wheat Dextrin (BENEFIBER) POWD Take 1 Scoop by mouth 3 (three) times daily as needed (bowel).    [provider]      Allergies    Augmentin [amoxicillin-pot clavulanate]    Review of Systems   Review of Systems  All other systems reviewed and are negative.   Physical Exam Updated Vital Signs BP (!) 145/67 (BP Location: Right Arm)   Pulse 92   Temp 98.5 F (36.9 C) (Oral)   Resp 16   Ht 5\' 5"  (1.651 m)   Wt 83.9 kg   SpO2 98%   BMI 30.79 kg/m  Physical Exam Vitals and nursing note reviewed.  Constitutional:  General: She is not in acute distress.    Appearance: She is well-developed.  HENT:     Head: Normocephalic and atraumatic.  Eyes:     Conjunctiva/sclera: Conjunctivae normal.  Cardiovascular:     Rate and Rhythm: Normal rate and regular rhythm.  Pulmonary:     Effort: Pulmonary effort is normal. No respiratory distress.     Breath sounds: Normal breath sounds. No wheezing, rhonchi or rales.  Abdominal:     Palpations: Abdomen is soft.     Tenderness: There is no abdominal tenderness.  Musculoskeletal:        General: No swelling.     Cervical back: Neck supple.     Right lower leg: Edema present.     Left lower leg: No edema.     Comments: Full range of motion of  bilateral hips, knees, ankles, digits.  Pedal pulses 2+ bilaterally.  No overlying erythema, palpable fluctuance/induration of right lower extremity.  Patient with 2+ pitting edema right lower extremity without edema on left lower extremity.  Tender to palpation diffusely of right-sided calf without obvious palpable cord/nodule.  Skin:    General: Skin is warm and dry.     Capillary Refill: Capillary refill takes less than 2 seconds.  Neurological:     Mental Status: She is alert.  Psychiatric:        Mood and Affect: Mood normal.     ED Results / Procedures / Treatments   Labs (all labs ordered are listed, but only abnormal results are displayed) Labs Reviewed  COMPREHENSIVE METABOLIC PANEL  CBC    EKG None  Radiology No results found.  Procedures .Critical Care  Performed by: Peter Garter, PA Authorized by: Peter Garter, PA   Critical care provider statement:    Critical care time (minutes):  56   Critical care was necessary to treat or prevent imminent or life-threatening deterioration of the following conditions: PE.   Critical care was time spent personally by me on the following activities:  Development of treatment plan with patient or surrogate, discussions with consultants, evaluation of patient's response to treatment, examination of patient, ordering and review of laboratory studies, ordering and review of radiographic studies, ordering and performing treatments and interventions, pulse oximetry, re-evaluation of patient's condition and review of old charts   I assumed direction of critical care for this patient from another provider in my specialty: no     Care discussed with: admitting provider     {Document cardiac monitor, telemetry assessment procedure when appropriate:1}  Medications Ordered in ED Medications - No data to display  ED Course/ Medical Decision Making/ A&P Clinical Course as of 12/23/22 1925  Sat Dec 23, 2022  1730 Upon  reassessment, push patient on whether or not she was experiencing even the slightest shortness of breath after DVT study showed pretty significant DVT.  She stated she may have been slightly dyspneic and agreed for CT angio PE study. [CR]    Clinical Course User Index [CR] Peter Garter, PA   {   Click here for ABCD2, HEART and other calculatorsREFRESH Note before signing :1}                              Medical Decision Making Amount and/or Complexity of Data Reviewed Labs: ordered. Radiology: ordered.  Risk Prescription drug management. Decision regarding hospitalization.   This patient presents to the ED for concern of right  leg swelling/calf pain, this involves an extensive number of treatment options, and is a complaint that carries with it a high risk of complications and morbidity.  The differential diagnosis includes DVT, PAD, compartment syndrome, CHF, fracture, dislocation, strain/sprain, necrotizing infection, cellulitis, erysipelas   Co morbidities that complicate the patient evaluation  See HPI   Additional history obtained:  Additional history obtained from EMR External records from outside source obtained and reviewed including hospital records   Lab Tests:  I Ordered, and personally interpreted labs.  The pertinent results include: No leukocytosis.  Patient without evidence of anemia with a hemoglobin of 10.1.  Platelets within range.  Mild hypocalcemia of 8.3 otherwise, electrolytes within normal limits.  No transaminitis.  No renal dysfunction.  Troponin, BNP pending.   Imaging Studies ordered:  I ordered imaging studies including right lower extremity ultrasound, CT angio chest PE I independently visualized and interpreted imaging which showed  Right lower extremity ultrasound: Occlusive DVT in right femoral, popliteal and peroneal vein CT angio PE: Small segmental right lower lobe PE.  No evidence of right heart strain.  Hepatic steatosis. I agree  with the radiologist interpretation  Cardiac Monitoring: / EKG:  The patient was maintained on a cardiac monitor.  I personally viewed and interpreted the cardiac monitored which showed an underlying rhythm of: Rhythm   Consultations Obtained:  See ED course   Problem List / ED Course / Critical interventions / Medication management  PE/DVT Reevaluation of the patient showed that the patient stayed the same I have reviewed the patients home medicines and have made adjustments as needed   Social Determinants of Health:  Former tobacco use.  Denies illicit drug use.   Test / Admission - Considered:  PE/DVT Vitals signs significant for hypertension with blood pressure 145/67. Otherwise within normal range and stable throughout visit. Laboratory/imaging studies significant for: See above *** Worrisome signs and symptoms were discussed with the patient, and the patient acknowledged understanding to return to the ED if noticed. Patient was stable upon discharge.    {Document critical care time when appropriate:1} {Document review of labs and clinical decision tools ie heart score, Chads2Vasc2 etc:1}  {Document your independent review of radiology images, and any outside records:1} {Document your discussion with family members, caretakers, and with consultants:1} {Document social determinants of health affecting pt's care:1} {Document your decision making why or why not admission, treatments were needed:1} Final Clinical Impression(s) / ED Diagnoses Final diagnoses:  None    Rx / DC Orders ED Discharge Orders     None

## 2022-12-24 ENCOUNTER — Observation Stay (HOSPITAL_COMMUNITY): Payer: Medicare Other

## 2022-12-24 ENCOUNTER — Other Ambulatory Visit (HOSPITAL_COMMUNITY): Payer: Self-pay | Admitting: *Deleted

## 2022-12-24 DIAGNOSIS — I2609 Other pulmonary embolism with acute cor pulmonale: Secondary | ICD-10-CM

## 2022-12-24 DIAGNOSIS — K219 Gastro-esophageal reflux disease without esophagitis: Secondary | ICD-10-CM

## 2022-12-24 DIAGNOSIS — I2693 Single subsegmental pulmonary embolism without acute cor pulmonale: Secondary | ICD-10-CM | POA: Diagnosis not present

## 2022-12-24 DIAGNOSIS — I5032 Chronic diastolic (congestive) heart failure: Secondary | ICD-10-CM | POA: Diagnosis not present

## 2022-12-24 DIAGNOSIS — I824Y1 Acute embolism and thrombosis of unspecified deep veins of right proximal lower extremity: Secondary | ICD-10-CM | POA: Diagnosis not present

## 2022-12-24 DIAGNOSIS — E669 Obesity, unspecified: Secondary | ICD-10-CM | POA: Diagnosis not present

## 2022-12-24 DIAGNOSIS — I2699 Other pulmonary embolism without acute cor pulmonale: Secondary | ICD-10-CM | POA: Diagnosis not present

## 2022-12-24 LAB — CBC
HCT: 28.4 % — ABNORMAL LOW (ref 36.0–46.0)
Hemoglobin: 9.3 g/dL — ABNORMAL LOW (ref 12.0–15.0)
MCH: 28 pg (ref 26.0–34.0)
MCHC: 32.7 g/dL (ref 30.0–36.0)
MCV: 85.5 fL (ref 80.0–100.0)
Platelets: 223 10*3/uL (ref 150–400)
RBC: 3.32 MIL/uL — ABNORMAL LOW (ref 3.87–5.11)
RDW: 12.8 % (ref 11.5–15.5)
WBC: 7.1 10*3/uL (ref 4.0–10.5)
nRBC: 0 % (ref 0.0–0.2)

## 2022-12-24 LAB — ECHOCARDIOGRAM COMPLETE
Area-P 1/2: 2.62 cm2
Height: 65 in
S' Lateral: 2.1 cm
Weight: 2984.15 [oz_av]

## 2022-12-24 LAB — BASIC METABOLIC PANEL
Anion gap: 10 (ref 5–15)
BUN: 10 mg/dL (ref 8–23)
CO2: 23 mmol/L (ref 22–32)
Calcium: 8.3 mg/dL — ABNORMAL LOW (ref 8.9–10.3)
Chloride: 103 mmol/L (ref 98–111)
Creatinine, Ser: 0.72 mg/dL (ref 0.44–1.00)
GFR, Estimated: >60 mL/min (ref >60)
Glucose, Bld: 130 mg/dL — ABNORMAL HIGH (ref 70–99)
Potassium: 3.4 mmol/L — ABNORMAL LOW (ref 3.5–5.1)
Sodium: 136 mmol/L (ref 135–145)

## 2022-12-24 LAB — HEPARIN LEVEL (UNFRACTIONATED)
Heparin Unfractionated: 0.28 [IU]/mL — ABNORMAL LOW (ref 0.30–0.70)
Heparin Unfractionated: 0.55 [IU]/mL (ref 0.30–0.70)

## 2022-12-24 MED ORDER — APIXABAN 5 MG PO TABS
10.0000 mg | ORAL_TABLET | Freq: Two times a day (BID) | ORAL | Status: DC
  Administered 2022-12-24: 10 mg via ORAL
  Filled 2022-12-24: qty 2

## 2022-12-24 MED ORDER — APIXABAN 5 MG PO TABS: 5.0000 mg | ORAL_TABLET | Freq: Two times a day (BID) | ORAL | Status: DC

## 2022-12-24 MED ORDER — HEPARIN BOLUS VIA INFUSION
1000.0000 [IU] | Freq: Once | INTRAVENOUS | Status: AC
  Administered 2022-12-24: 1000 [IU] via INTRAVENOUS
  Filled 2022-12-24: qty 1000

## 2022-12-24 MED ORDER — PERFLUTREN LIPID MICROSPHERE
1.0000 mL | INTRAVENOUS | Status: AC | PRN
  Administered 2022-12-24: 4 mL via INTRAVENOUS

## 2022-12-24 MED ORDER — APIXABAN (ELIQUIS) VTE STARTER PACK (10MG AND 5MG): ORAL_TABLET | ORAL | 0 refills | Status: AC

## 2022-12-24 MED ORDER — ACETAMINOPHEN 500 MG PO TABS: 1000.0000 mg | ORAL_TABLET | Freq: Three times a day (TID) | ORAL | Status: AC | PRN

## 2022-12-24 NOTE — Progress Notes (Signed)
ANTICOAGULATION CONSULT NOTE - Follow Up Consult  Pharmacy Consult for heparin Indication:  PE/DVT  Labs: Recent Labs    12/23/22 1231 12/23/22 1848 12/23/22 2117 12/24/22 0303  HGB 10.1*  --   --  9.3*  HCT 31.5*  --   --  28.4*  PLT 248  --   --  223  HEPARINUNFRC  --   --   --  0.28*  CREATININE 0.69  --   --  0.72  TROPONINIHS  --  5 6  --     Assessment: 72yo female subtherapeutic on heparin with initial dosing for acute PE/DVT; no infusion issues or signs of bleeding per RN.  Follow up heparin level now at goal (0.55) on 1350 units/hr. No bleeding or IV issues noted. Possible transition to oral anticoagulation later today pending echo results.   Goal of Therapy:  Heparin level 0.3-0.7 units/ml   Plan:  Continue heparin at 1350 units/hr Daily heparin level and cbc    Sheppard Coil PharmD., BCPS Clinical Pharmacist 12/24/2022 7:42 AM

## 2022-12-24 NOTE — Progress Notes (Signed)
Discharge instructions given patient verbalized understanding. Stopped heparin drip. Eliquis PO given. Dsicharged by wheelchair by private vehicle.

## 2022-12-24 NOTE — Discharge Instructions (Addendum)
Information on my medicine - ELIQUIS (apixaban)  This medication education was reviewed with me or my healthcare representative as part of my discharge preparation.  The pharmacist that spoke with me during my hospital stay was:  Severiano Gilbert, [facility]  Why was Eliquis prescribed for you? Eliquis was prescribed to treat blood clots that may have been found in the veins of your legs (deep vein thrombosis) or in your lungs (pulmonary embolism) and to reduce the risk of them occurring again.  What do You need to know about Eliquis ? The starting dose is 10 mg (two 5 mg tablets) taken TWICE daily for the FIRST SEVEN (7) DAYS, then on 12/31/22  the dose is reduced to ONE 5 mg tablet taken TWICE daily.  Eliquis may be taken with or without food.   Try to take the dose about the same time in the morning and in the evening. If you have difficulty swallowing the tablet whole please discuss with your pharmacist how to take the medication safely.  Take Eliquis exactly as prescribed and DO NOT stop taking Eliquis without talking to the doctor who prescribed the medication.  Stopping may increase your risk of developing a new blood clot.  Refill your prescription before you run out.  After discharge, you should have regular check-up appointments with your healthcare provider that is prescribing your Eliquis.    What do you do if you miss a dose? If a dose of ELIQUIS is not taken at the scheduled time, take it as soon as possible on the same day and twice-daily administration should be resumed. The dose should not be doubled to make up for a missed dose.  Important Safety Information A possible side effect of Eliquis is bleeding. You should call your healthcare provider right away if you experience any of the following: Bleeding from an injury or your nose that does not stop. Unusual colored urine (red or dark brown) or unusual colored stools (red or black). Unusual bruising for unknown  reasons. A serious fall or if you hit your head (even if there is no bleeding).  Some medicines may interact with Eliquis and might increase your risk of bleeding or clotting while on Eliquis. To help avoid this, consult your healthcare provider or pharmacist prior to using any new prescription or non-prescription medications, including herbals, vitamins, non-steroidal anti-inflammatory drugs (NSAIDs) and supplements.  This website has more information on Eliquis (apixaban): http://www.eliquis.com/eliquis/home

## 2022-12-24 NOTE — Progress Notes (Signed)
ANTICOAGULATION CONSULT NOTE - Follow Up Consult  Pharmacy Consult for heparin Indication:  PE/DVT  Labs: Recent Labs    12/23/22 1231 12/23/22 1848 12/23/22 2117 12/24/22 0303  HGB 10.1*  --   --  9.3*  HCT 31.5*  --   --  28.4*  PLT 248  --   --  223  HEPARINUNFRC  --   --   --  0.28*  CREATININE 0.69  --   --  0.72  TROPONINIHS  --  5 6  --     Assessment: 72yo female subtherapeutic on heparin with initial dosing for acute PE/DVT; no infusion issues or signs of bleeding per RN.  Goal of Therapy:  Heparin level 0.3-0.7 units/ml   Plan:  1000 units heparin bolus. Increase heparin infusion by 2 units/kg/hr to 1350 units/hr. Check level in 6 hours.   Vernard Gambles, PharmD, BCPS 12/24/2022 3:47 AM

## 2022-12-24 NOTE — Progress Notes (Signed)
*  PRELIMINARY RESULTS* Echocardiogram 2D Echocardiogram has been performed with Definity.  Bianca Vasquez 12/24/2022, 10:19 AM

## 2022-12-24 NOTE — Discharge Summary (Signed)
Physician Discharge Summary   Patient: Bianca Vasquez MRN: [id number] DOB: Aug 25, 1950  Admit date:     12/23/2022  Discharge date: 12/24/22  Discharge Physician: Vassie Loll   PCP: Orvilla Cornwall C   Recommendations at discharge:  Repeat CBC to follow hemoglobin trend/stability -Repeat complete metabolic panel to follow electrolytes, renal function LFTs -Outpatient follow-up with vascular surgery in the next 6-8 weeks  Discharge Diagnoses: Principal Problem:   Pulmonary embolism (HCC) Active Problems:   GERD (gastroesophageal reflux disease)   Acute DVT (deep venous thrombosis) (HCC)   Class 1 obesity   Chronic diastolic heart failure [facility])  Brief Hospital admission narrative: As per H&P written by Dr. Randol Kern on 830/24 Bianca Vasquez  is a 72 y.o. female, past medical history of colon cancer, s/p resection, GERD, hyperlipidemia, hypothyroidism. -Patient presents to ED secondary to complaints of leg swelling, reports leg pain for last 2 days, and she reports long distance driving x 3 hours yesterday, for which she developed after significant right lower extremity edema, and pain, was on estrogen therapy secondary to recurrent UTIs (per patient), denies fever, chills, chest pain or dyspnea -In ED venous Doppler significant for occlusive DVT in right femoral, popliteal and peroneal veins, CTA chest significant for with mild segmental right lower lobe PE, patient was started on heparin GTT, and [facility] hospitalist consulted to admit.  Assessment and Plan: 1-acute pulmonary embolism/acute right lower extremity DVT -Patient reports improvement in her right lower extremity swelling and pain -No complaints of chest pain and no requiring oxygen supplementation -2D echo demonstrating normal right ventricle dysfunction. -Patient hemodynamically stable and ready to go home on Eliquis -Outpatient follow-up with vascular surgery for DVT concerns in the next 6 to 8  weeks.  2-gastroesophageal reflux disease -Continue PPI and Pepcid -Continue patient follow-up with gastroenterology service  3-hypothyroidism Continue Synthroid  4-history of depression -No suicidal ideation or hallucination -Continue home antidepressant regimen.  5-hyperlipidemia -Continue statin.  6-history of colon cancer -S/p resection; patient continue following with gastroenterology and continue having regular colonoscopy surveillance -Last 1 demonstrated to be clean without any recurrences.  7-class I obesity -Body mass index is 31.04 kg/m. -Low-calorie diet, portion control and increase physical activity discussed with patient.  8-chronic diastolic heart failure -Grade 1 as per 2D echo results -No wall motion abnormalities and preserved ejection fraction -Continue low-sodium diet, adequate hydration and check weight on daily basis.  Consultants: Dr. Randie Heinz (vascular surgery) curbside by Dr. Randol Kern. Procedures performed: See below for x-ray reports. Disposition: Home Diet recommendation: Heart healthy diet.  DISCHARGE MEDICATION: Allergies as of 12/24/2022       Reactions   Augmentin [amoxicillin-pot Clavulanate] Nausea And Vomiting   Amoxicillin Nausea And Vomiting, Other (See Comments)        Medication List     TAKE these medications    acetaminophen 500 MG tablet Commonly known as: TYLENOL Take 2 tablets (1,000 mg total) by mouth every 8 (eight) hours as needed for mild pain, moderate pain, fever or headache.   amitriptyline 25 MG tablet Commonly known as: ELAVIL Take 25 mg by mouth at bedtime.   Apixaban Starter Pack (10mg  and 5mg ) Commonly known as: ELIQUIS STARTER PACK Take as directed on package: start with two-5mg  tablets twice daily for 7 days. On day 8, switch to one-5mg  tablet twice daily.   Apoaequorin 10 MG Caps Take 10 mg by mouth daily at 6 (six) AM.   atorvastatin 10 MG tablet Commonly known as: LIPITOR Take 10 mg by  mouth  daily.   Benefiber Powd Take 1 Scoop by mouth in the morning and at bedtime.   Biotin 5000 MCG Tabs Take 5,000 mcg by mouth daily.   buPROPion 150 MG 12 hr tablet Commonly known as: WELLBUTRIN SR Take 150 mg by mouth 2 (two) times daily.   CRANBERRY PO Take 1 capsule by mouth daily.   cyanocobalamin 1000 MCG tablet Commonly known as: VITAMIN B12 Take 2,500 mcg by mouth daily.   famotidine 20 MG tablet Commonly known as: PEPCID Take 20 mg by mouth at bedtime.   levothyroxine 100 MCG tablet Commonly known as: SYNTHROID Take 100 mcg by mouth daily before breakfast.   loratadine 10 MG tablet Commonly known as: CLARITIN Take 10 mg by mouth daily.   magnesium oxide 400 (240 Mg) MG tablet Commonly known as: MAG-OX Take 400 mg by mouth daily.   montelukast 10 MG tablet Commonly known as: SINGULAIR Take 10 mg by mouth at bedtime.   pantoprazole 40 MG tablet Commonly known as: PROTONIX TAKE 1 TABLET BY MOUTH DAILY   sertraline 100 MG tablet Commonly known as: ZOLOFT Take 100 mg by mouth daily.        Follow-up Information     Wendall Papa. Schedule an appointment as soon as possible for a visit in 10 day(s).   Specialty: Nurse Practitioner Contact information: [address] [place] [address] [phone]         Maeola Harman, MD. Schedule an appointment as soon as possible for a visit in 8 week(s).   Specialties: Vascular Surgery, Cardiology Contact information: [address] [place] [address] [phone]                Discharge Exam: Ceasar Mons Weights   12/23/22 1108 12/23/22 2126  Weight: 83.9 kg 84.6 kg   General exam: Alert, awake, oriented x 3; no chest pain, no shortness of breath, no nausea, no vomiting. Respiratory system: Clear to auscultation. Respiratory effort normal. Cardiovascular system:RRR. No murmurs, rubs, gallops. Gastrointestinal system: Abdomen is nondistended, soft and nontender. No organomegaly  or masses felt. Normal bowel sounds heard. Central nervous system: Alert and oriented. No focal neurological deficits. Extremities: No cyanosis or clubbing.  Right lower extremity slightly more swollen and warmer in comparison to left lower extremity.  Patient reports no pain. Skin: No petechiae. Psychiatry: Judgement and insight appear normal. Mood & affect appropriate.    Condition at discharge: Stable and improved.  The results of significant diagnostics from this hospitalization (including imaging, microbiology, ancillary and laboratory) are listed below for reference.   Imaging Studies: ECHOCARDIOGRAM COMPLETE  Result Date: 12/24/2022    ECHOCARDIOGRAM REPORT   Patient Name:   Bianca Vasquez Bianca Vasquez Date of Exam: 12/24/2022 Medical Rec #:  [id number]             Height:       65.0 in Accession #:    [id number]            Weight:       186.5 lb Date of Birth:  09/17/1950             BSA:          1.920 m Patient Age:    72 years              BP:           124/57 mmHg Patient Gender: F  HR:           79 bpm. [facility] Procedure: 2D Echo, Cardiac Doppler, Color Doppler and Intracardiac            Opacification Agent Indications:    Pulmonary Embolus I26.09  History:        Patient has no prior history of Echocardiogram examinations.                 Risk Factors:Dyslipidemia. Hx of cancer. Acute DVT (deep venous                 thrombosis) (HCC).  Sonographer:    Celesta Gentile RCS Referring Phys: [phone] [place]WO[place] S [place] IMPRESSIONS  1. Left ventricular ejection fraction, by estimation, is 60 to 65%. The left ventricle has normal function. The left ventricle has no regional wall motion abnormalities. Left ventricular diastolic parameters are consistent with Grade I diastolic dysfunction (impaired relaxation).  2. Right ventricular systolic function is normal. The right ventricular size is normal. Tricuspid regurgitation signal is inadequate for assessing PA pressure.  3.  The mitral valve is grossly normal. No evidence of mitral valve regurgitation.  4. The aortic valve is grossly normal. Aortic valve regurgitation is not visualized. No aortic stenosis is present.  5. The inferior vena cava is normal in size with greater than 50% respiratory variability, suggesting right atrial pressure of 3 mmHg. FINDINGS  Left Ventricle: Left ventricular ejection fraction, by estimation, is 60 to 65%. The left ventricle has normal function. The left ventricle has no regional wall motion abnormalities. Definity contrast agent was given IV to delineate the left ventricular  endocardial borders. The left ventricular internal cavity size was normal in size. There is no left ventricular hypertrophy. Left ventricular diastolic parameters are consistent with Grade I diastolic dysfunction (impaired relaxation). Right Ventricle: The right ventricular size is normal. Right ventricular systolic function is normal. Tricuspid regurgitation signal is inadequate for assessing PA pressure. Left Atrium: Left atrial size was normal in size. Right Atrium: Right atrial size was normal in size. Pericardium: There is no evidence of pericardial effusion. Mitral Valve: The mitral valve is grossly normal. No evidence of mitral valve regurgitation. Tricuspid Valve: Tricuspid valve regurgitation is not demonstrated. Aortic Valve: The aortic valve is grossly normal. Aortic valve regurgitation is not visualized. No aortic stenosis is present. Pulmonic Valve: Pulmonic valve regurgitation is not visualized. Aorta: The aortic root and ascending aorta are structurally normal, with no evidence of dilitation. Venous: The inferior vena cava is normal in size with greater than 50% respiratory variability, suggesting right atrial pressure of 3 mmHg. IAS/Shunts: No atrial level shunt detected by color flow Doppler.  LEFT VENTRICLE PLAX 2D LVIDd:         4.30 cm   Diastology LVIDs:         2.10 cm   LV e' medial:    7.07 cm/s LV PW:          0.90 cm   LV E/e' medial:  10.2 LV IVS:        0.90 cm   LV e' lateral:   11.50 cm/s LVOT diam:     2.00 cm   LV E/e' lateral: 6.2 LV SV:         76 LV SV Index:   40 LVOT Area:     3.14 cm  RIGHT VENTRICLE RV S prime:     16.50 cm/s TAPSE (M-mode): 1.9 cm LEFT ATRIUM  Index        RIGHT ATRIUM           Index LA diam:        3.60 cm 1.87 cm/m   RA Area:     12.70 cm LA Vol (A2C):   57.8 ml 30.10 ml/m  RA Volume:   27.60 ml  14.37 ml/m LA Vol (A4C):   45.0 ml 23.43 ml/m LA Biplane Vol: 51.5 ml 26.82 ml/m  AORTIC VALVE LVOT Vmax:   109.00 cm/s LVOT Vmean:  73.400 cm/s LVOT VTI:    0.242 m  AORTA Ao Root diam: 2.70 cm MITRAL VALVE MV Area (PHT): 2.62 cm     SHUNTS MV Decel Time: 289 msec     Systemic VTI:  0.24 m MV E velocity: 71.80 cm/s   Systemic Diam: 2.00 cm MV A velocity: 110.00 cm/s MV E/A ratio:  0.65 Photographer signed by Carolan Clines Signature Date/Time: 12/24/2022/1:20:45 PM    Final    CT Angio Chest PE W/Cm &/Or Wo Cm  Result Date: 12/23/2022 CLINICAL DATA:  Right lower extremity DVT on earlier ultrasound, pain with ambulation EXAM: CT ANGIOGRAPHY CHEST WITH CONTRAST TECHNIQUE: Multidetector CT imaging of the chest was performed using the standard protocol during bolus administration of intravenous contrast. Multiplanar CT image reconstructions and MIPs were obtained to evaluate the vascular anatomy. RADIATION DOSE REDUCTION: This exam was performed according to the departmental dose-optimization program which includes automated exposure control, adjustment of the mA and/or kV according to patient size and/or use of iterative reconstruction technique. CONTRAST:  75mL OMNIPAQUE IOHEXOL 350 MG/ML SOLN COMPARISON:  06/28/2020 FINDINGS: Cardiovascular: This is a technically adequate evaluation of the pulmonary vasculature. There are small right lower lobe segmental pulmonary emboli. Minimal clot burden. No evidence of right heart strain. The heart is unremarkable without  pericardial effusion. No evidence of thoracic aortic aneurysm or dissection. Atherosclerosis of the aorta and coronary vasculature. Mediastinum/Nodes: No enlarged mediastinal, hilar, or axillary lymph nodes. Thyroid gland, trachea, and esophagus demonstrate no significant findings. Small hiatal hernia. Lungs/Pleura: No acute airspace disease, effusion, or pneumothorax. Central airways are patent. Upper Abdomen: Hepatic steatosis. No acute upper abdominal findings. Musculoskeletal: No acute or destructive bony abnormalities. Reconstructed images demonstrate no additional findings. Review of the MIP images confirms the above findings. IMPRESSION: 1. Small segmental right lower lobe pulmonary emboli in this patient with a known history of right lower extremity DVT. Minimal clot burden, with no evidence of right heart strain. 2. Aortic Atherosclerosis (ICD10-I70.0). Coronary artery atherosclerosis. 3. Hepatic steatosis. Critical Value/emergent results were called by telephone at the time of interpretation on 12/23/2022 at 6:28 pm to provider COOPER ROBBINS , who verbally acknowledged these results. Electronically Signed   By: Sharlet Salina M.D.   On: 12/23/2022 18:40   US Venous Img Lower Right (DVT Study)  Result Date: 12/23/2022 CLINICAL DATA:  Right lower extremity swelling and pain, erythema EXAM: RIGHT LOWER EXTREMITY VENOUS DOPPLER ULTRASOUND TECHNIQUE: Gray-scale sonography with graded compression, as well as color Doppler and duplex ultrasound were performed to evaluate the lower extremity deep venous systems from the level of the common femoral vein and including the common femoral, femoral, profunda femoral, popliteal and calf veins including the posterior tibial, peroneal and gastrocnemius veins when visible. The superficial great saphenous vein was also interrogated. Spectral Doppler was utilized to evaluate flow at rest and with distal augmentation maneuvers in the common femoral, femoral and popliteal  veins. COMPARISON:  None Available. FINDINGS: Contralateral  Common Femoral Vein: Respiratory phasicity is normal and symmetric with the symptomatic side. No evidence of thrombus. Normal compressibility. Common Femoral Vein: No evidence of thrombus. Normal compressibility, respiratory phasicity and response to augmentation. Saphenofemoral Junction: No evidence of thrombus. Normal compressibility and flow on color Doppler imaging. Profunda Femoral Vein: No evidence of thrombus. Normal compressibility and flow on color Doppler imaging. 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 There is occlusive thrombus within 1 of the femoral veins, extending throughout the entire course involving the popliteal vein. This vessel is noncompressible with no internal Doppler flow. The other femoral vein appears patent. Popliteal Vein: Occlusive thrombus throughout the popliteal vein. The vessel is noncompressible with no color flow detected. Calf Veins: There is occlusive thrombus within the right peroneal vein, which is noncompressible. The posterior tibial vein appears patent. Superficial Great Saphenous Vein: No evidence of thrombus. Normal compressibility. Other Findings:  None. IMPRESSION: 1. Occlusive deep venous thrombosis involving the right femoral vein, popliteal vein, and peroneal vein as above. These results will be called to the ordering clinician or representative by the Radiologist Assistant, and communication documented in the PACS or [employer]. Electronically Signed   By: Sharlet Salina M.D.   On: 12/23/2022 16:43    Microbiology: Results for orders placed or performed during the hospital encounter of 06/21/20  SARS CORONAVIRUS 2 (TAT 6-24 HRS) Nasopharyngeal Nasopharyngeal Swab     Status: None   Collection Time: 06/21/20  9:10 AM   Specimen: Nasopharyngeal Swab  Result Value Ref Range Status   SARS Coronavirus 2 NEGATIVE NEGATIVE Final    Comment: (NOTE) SARS-CoV-2 target  nucleic acids are NOT DETECTED.  The SARS-CoV-2 RNA is generally detectable in upper and lower respiratory specimens during the acute phase of infection. Negative results do not preclude SARS-CoV-2 infection, do not rule out co-infections with other pathogens, and should not be used as the sole basis for treatment or other patient management decisions. Negative results must be combined with clinical observations, patient history, and epidemiological information. The expected result is Negative.  Fact Sheet for Patients: HairSlick.no  Fact Sheet for Healthcare Providers: quierodirigir.com  This test is not yet approved or cleared by the [place] FDA and  has been authorized for detection and/or diagnosis of SARS-CoV-2 by FDA under an Emergency Use Authorization (EUA). This EUA will remain  in effect (meaning this test can be used) for the duration of the COVID-19 declaration under Se ction 564(b)(1) of the Act, 21 U.S.C. section 360bbb-3(b)(1), unless the authorization is terminated or revoked sooner.  Performed at [facility], [address], [place], [place] [address]     Labs: CBC: Recent Labs  Lab 12/23/22 1231 12/24/22 0303  WBC 9.7 7.1  HGB 10.1* 9.3*  HCT 31.5* 28.4*  MCV 85.8 85.5  PLT 248 223   Basic Metabolic Panel: Recent Labs  Lab 12/23/22 1231 12/24/22 0303  NA 135 136  K 3.9 3.4*  CL 102 103  CO2 25 23  GLUCOSE 120* 130*  BUN 9 10  CREATININE 0.69 0.72  CALCIUM 8.3* 8.3*   Liver Function Tests: Recent Labs  Lab 12/23/22 1231  AST 15  ALT 14  ALKPHOS 89  BILITOT 0.4  PROT 7.0  ALBUMIN 3.3*   CBG: No results for input(s): "GLUCAP" in the last 168 hours.  Discharge time spent: greater than 30 minutes.  Signed: Vassie Loll, MD [facility] 12/24/2022

## 2022-12-24 NOTE — Care Management Obs Status (Signed)
MEDICARE OBSERVATION STATUS NOTIFICATION   Patient Details  Name: Bianca Vasquez MRN: [id number] Date of Birth: August 04, 1950   Medicare Observation Status Notification Given:  Yes    Grazia Taffe Marsh Dolly, LCSW 12/24/2022, 2:35 PM

## 2022-12-24 NOTE — Progress Notes (Signed)
   12/24/22 1146  TOC Brief Assessment  Insurance and Status Reviewed  Patient has primary care physician Yes  Home environment has been reviewed From Home/spouse  Prior level of function: Independent  Prior/Current Home Services No current home services  Social Determinants of Health Reivew SDOH reviewed no interventions necessary  Readmission risk has been reviewed Yes  Transition of care needs no transition of care needs at this time    Transition of Care Department [facility]) has reviewed patient and no TOC needs have been identified at this time. We will continue to monitor patient advancement through interdisciplinary progression rounds. If new patient transition needs arise, please place a TOC consult.

## 2022-12-27 DIAGNOSIS — R197 Diarrhea, unspecified: Secondary | ICD-10-CM | POA: Diagnosis not present

## 2022-12-27 DIAGNOSIS — R159 Full incontinence of feces: Secondary | ICD-10-CM | POA: Diagnosis not present

## 2023-01-03 DIAGNOSIS — R3989 Other symptoms and signs involving the genitourinary system: Secondary | ICD-10-CM | POA: Diagnosis not present

## 2023-01-03 DIAGNOSIS — N390 Urinary tract infection, site not specified: Secondary | ICD-10-CM | POA: Diagnosis not present

## 2023-01-03 DIAGNOSIS — I700 Atherosclerosis of aorta: Secondary | ICD-10-CM | POA: Diagnosis not present

## 2023-01-03 DIAGNOSIS — Z299 Encounter for prophylactic measures, unspecified: Secondary | ICD-10-CM | POA: Diagnosis not present

## 2023-01-03 DIAGNOSIS — I10 Essential (primary) hypertension: Secondary | ICD-10-CM | POA: Diagnosis not present

## 2023-01-03 LAB — HEMOGLOBIN A1C
Est. average glucose Bld gHb Est-mCnc: 131 mg/dL
Hgb A1c MFr Bld: 6.2 % — ABNORMAL HIGH (ref 4.8–5.6)

## 2023-01-03 LAB — VITAMIN B12: Vitamin B-12: 1702 pg/mL — ABNORMAL HIGH (ref 232–1245)

## 2023-01-03 LAB — ACHR ABS WITH REFLEX TO MUSK: AChR Binding Ab, Serum: <0.03 nmol/L (ref 0.00–0.24)

## 2023-01-03 LAB — MUSK ANTIBODIES: MuSK Antibodies: <1 U/mL

## 2023-01-03 LAB — ALDOLASE: Aldolase: 4.3 U/L (ref 3.3–10.3)

## 2023-01-03 LAB — CK: Total CK: 26 U/L — ABNORMAL LOW (ref 32–182)

## 2023-01-04 LAB — PANCREATIC ELASTASE, FECAL: Pancreatic Elastase-1, Stool: >500 ug/g

## 2023-01-04 LAB — FECAL FAT, QUALITATIVE: FECAL FAT, QUALITATIVE: NORMAL

## 2023-01-09 ENCOUNTER — Encounter (HOSPITAL_COMMUNITY): Payer: Medicare Other

## 2023-01-09 ENCOUNTER — Encounter (HOSPITAL_COMMUNITY)
Admission: RE | Admit: 2023-01-09 | Discharge: 2023-01-09 | Disposition: A | Discharge status: Home / Self Care | Payer: Medicare Other | Source: Ambulatory Visit | Attending: Diagnostic Neuroimaging | Admitting: Diagnostic Neuroimaging

## 2023-01-09 DIAGNOSIS — R269 Unspecified abnormalities of gait and mobility: Secondary | ICD-10-CM | POA: Insufficient documentation

## 2023-01-09 DIAGNOSIS — R531 Weakness: Secondary | ICD-10-CM | POA: Insufficient documentation

## 2023-01-09 DIAGNOSIS — R251 Tremor, unspecified: Secondary | ICD-10-CM | POA: Insufficient documentation

## 2023-01-10 ENCOUNTER — Telehealth: Payer: Self-pay

## 2023-01-10 NOTE — Telephone Encounter (Signed)
Called patient and informed her Dr. Marjory Lies looked over her labs. Per Dr. Marjory Lies "Unremarkable labs. Continue current plan. Please call patient." Pt verbalized understanding. Pt had no questions at this time but was encouraged to call back if questions arise.

## 2023-01-10 NOTE — Telephone Encounter (Signed)
-----   Message from Glenford [facility]ley [facility] sent at 01/10/2023  4:29 PM EDT ----- Unremarkable labs. Continue current plan. Please call patient. -VRP

## 2023-01-16 ENCOUNTER — Other Ambulatory Visit: Payer: Self-pay | Admitting: Internal Medicine

## 2023-01-19 ENCOUNTER — Encounter (HOSPITAL_COMMUNITY): Payer: Medicare Other

## 2023-01-19 ENCOUNTER — Encounter (HOSPITAL_COMMUNITY): Payer: Self-pay

## 2023-01-19 ENCOUNTER — Encounter (HOSPITAL_COMMUNITY): Admission: RE | Admit: 2023-01-19 | Payer: Medicare Other | Source: Ambulatory Visit

## 2023-02-02 ENCOUNTER — Encounter (HOSPITAL_COMMUNITY)
Admission: RE | Admit: 2023-02-02 | Discharge: 2023-02-02 | Disposition: A | Discharge status: Home / Self Care | Payer: Medicare Other | Source: Ambulatory Visit | Attending: Diagnostic Neuroimaging | Admitting: Diagnostic Neuroimaging

## 2023-02-02 DIAGNOSIS — R531 Weakness: Secondary | ICD-10-CM | POA: Diagnosis not present

## 2023-02-02 DIAGNOSIS — R251 Tremor, unspecified: Secondary | ICD-10-CM | POA: Diagnosis not present

## 2023-02-02 DIAGNOSIS — R269 Unspecified abnormalities of gait and mobility: Secondary | ICD-10-CM | POA: Diagnosis not present

## 2023-02-02 MED ORDER — POTASSIUM IODIDE (ANTIDOTE) 130 MG PO TABS
ORAL_TABLET | ORAL | Status: AC
  Filled 2023-02-02: qty 1

## 2023-02-02 MED ORDER — IOFLUPANE I 123 185 MBQ/2.5ML IV SOLN
4.6000 | Freq: Once | INTRAVENOUS | Status: AC | PRN
  Administered 2023-02-02: 4.6 via INTRAVENOUS

## 2023-02-06 ENCOUNTER — Ambulatory Visit
Admission: RE | Admit: 2023-02-06 | Discharge: 2023-02-06 | Disposition: A | Discharge status: Home / Self Care | Payer: Medicare Other | Source: Ambulatory Visit | Attending: Diagnostic Neuroimaging | Admitting: Diagnostic Neuroimaging

## 2023-02-06 DIAGNOSIS — R251 Tremor, unspecified: Secondary | ICD-10-CM | POA: Diagnosis not present

## 2023-02-06 DIAGNOSIS — R269 Unspecified abnormalities of gait and mobility: Secondary | ICD-10-CM | POA: Diagnosis not present

## 2023-02-06 DIAGNOSIS — R531 Weakness: Secondary | ICD-10-CM | POA: Diagnosis not present

## 2023-02-06 MED ORDER — GADOPICLENOL 0.5 MMOL/ML IV SOLN
9.0000 mL | Freq: Once | INTRAVENOUS | Status: AC | PRN
  Administered 2023-02-06: 9 mL via INTRAVENOUS

## 2023-02-12 ENCOUNTER — Telehealth: Payer: Self-pay | Admitting: Diagnostic Neuroimaging

## 2023-02-12 NOTE — Telephone Encounter (Signed)
Pt is asking to be called with results to DAT scan and MRI

## 2023-02-13 ENCOUNTER — Telehealth: Payer: Self-pay | Admitting: Anesthesiology

## 2023-02-13 NOTE — Telephone Encounter (Signed)
-----   Message from Glenford [facility]ley [facility] sent at 02/13/2023  4:47 PM EDT ----- Unremarkable imaging results. Please call patient. Continue current plan. -VRP

## 2023-02-13 NOTE — Telephone Encounter (Signed)
Called pt and informed of DAT scan and MRI results. Per Dr Marjory Lies both scans came back normal. Pt was appreciative for the call.

## 2023-02-14 DIAGNOSIS — I82409 Acute embolism and thrombosis of unspecified deep veins of unspecified lower extremity: Secondary | ICD-10-CM | POA: Diagnosis not present

## 2023-02-14 DIAGNOSIS — N390 Urinary tract infection, site not specified: Secondary | ICD-10-CM | POA: Diagnosis not present

## 2023-02-14 DIAGNOSIS — M79606 Pain in leg, unspecified: Secondary | ICD-10-CM | POA: Diagnosis not present

## 2023-02-14 DIAGNOSIS — Z299 Encounter for prophylactic measures, unspecified: Secondary | ICD-10-CM | POA: Diagnosis not present

## 2023-02-14 DIAGNOSIS — I10 Essential (primary) hypertension: Secondary | ICD-10-CM | POA: Diagnosis not present

## 2023-02-15 ENCOUNTER — Encounter: Payer: Self-pay | Admitting: Orthopaedic Surgery

## 2023-02-15 ENCOUNTER — Other Ambulatory Visit (INDEPENDENT_AMBULATORY_CARE_PROVIDER_SITE_OTHER): Payer: Medicare Other

## 2023-02-15 ENCOUNTER — Ambulatory Visit: Payer: Medicare Other | Admitting: Orthopaedic Surgery

## 2023-02-15 VITALS — Ht 65.0 in | Wt 179.0 lb

## 2023-02-15 DIAGNOSIS — M25571 Pain in right ankle and joints of right foot: Secondary | ICD-10-CM

## 2023-02-16 NOTE — Progress Notes (Signed)
Office Visit Note   Patient: Bianca Vasquez           Date of Birth: [birth year]           MRN: [id number] Visit Date: 02/15/2023              Requested by: Wendall Papa [address] [place],  [place] [address] PCP: Wendall Papa   Assessment & Plan: Visit Diagnoses:  1. Pain in right ankle and joints of right foot     Plan: Patient with a right lateral ankle sprain issues getting in a car.  He did not have any stirrup splint so we will place her in an ASO that she can use for a few weeks.  She can continue some ice elevation Tylenol.  Pathophysiology discussed she can return if she has persistent symptoms.  Follow-Up Instructions: No follow-ups on file.   Orders:  Orders Placed This Encounter  Procedures   XR Ankle Complete Right   No orders of the defined types were placed in this encounter.     Procedures: No procedures performed   Clinical Data: No additional findings.   Subjective: Chief Complaint  Patient presents with   Right Ankle - Pain    DOI 02/10/2023    HPI 72 year old female with new injury on 02/10/2023 getting into a car on the back seat passenger side when she put her left foot in the floor mat shifted and she slipped down turning her right ankle.  She states the ankle went back behind her she said pain swelling tenderness mild ecchymosis over the lateral malleolus.  Patient had past history of DVT GERD pulmonary embolus.  Review of Systems all other systems noncontributory to HPI.   Objective: Vital Signs: Ht 5\' 5"  (1.651 m)   Wt 179 lb (81.2 kg)   BMI 29.79 kg/m   Physical Exam Constitutional:      Appearance: She is well-developed.  HENT:     Head: Normocephalic.     Right Ear: External ear normal.     Left Ear: External ear normal. There is no impacted cerumen.  Eyes:     Pupils: Pupils are equal, round, and reactive to light.  Neck:     Thyroid: No thyromegaly.     Trachea: No tracheal deviation.   Cardiovascular:     Rate and Rhythm: Normal rate.  Pulmonary:     Effort: Pulmonary effort is normal.  Abdominal:     Palpations: Abdomen is soft.  Musculoskeletal:     Cervical back: No rigidity.  Skin:    General: Skin is warm and dry.  Neurological:     Mental Status: She is alert and oriented to person, place, and time.  Psychiatric:        Behavior: Behavior normal.     Ortho Exam patient has tenderness over the anterior talofibular ligament mild swelling laterally no significant tenderness over the calcaneal or posterior talofibular ligament.  Peroneal is active posterior tib is normal.  Specialty Comments:  No specialty comments available.  Imaging: No results found.   PMFS History: Patient Active Problem List   Diagnosis Date Noted   Class 1 obesity 12/24/2022   Chronic diastolic heart failure (HCC) 12/24/2022   Pulmonary embolism (HCC) 12/23/2022   Acute DVT (deep venous thrombosis) (HCC) 12/23/2022   Diarrhea 12/19/2022   Incontinence of feces 12/19/2022   Upper airway cough syndrome 11/02/2021   DOE (dyspnea on exertion) 11/01/2021   Iron deficiency anemia 06/03/2020  GERD (gastroesophageal reflux disease) 06/03/2020   Past Medical History:  Diagnosis Date   Anemia    Cancer (HCC)    colon   Cancer of skin of external nose    Depression    GERD (gastroesophageal reflux disease)    High cholesterol    Hypothyroidism     Family History  Problem Relation Age of Onset   COPD Mother    COPD Father    Heart disease Sister    Kidney disease Sister    Fibromyalgia Sister    Migraines Sister    Emphysema Sister    Healthy Brother    Cancer - Lung Maternal Aunt    Cancer Maternal Uncle     Past Surgical History:  Procedure Laterality Date   ABDOMINAL HYSTERECTOMY     total   BIOPSY  06/22/2020   Procedure: BIOPSY;  Surgeon: Dolores Frame, MD;  Location: [facility];  Service: Gastroenterology;;  duodenum   CHOLECYSTECTOMY      COLECTOMY  07/2020   COLONOSCOPY     COLONOSCOPY WITH PROPOFOL N/A 06/22/2020   Procedure: COLONOSCOPY WITH PROPOFOL;  Surgeon: Dolores Frame, MD;  Location: [facility];  Service: Gastroenterology;  Laterality: N/A;  am   COLONOSCOPY WITH PROPOFOL N/A 11/29/2021   Procedure: COLONOSCOPY WITH PROPOFOL;  Surgeon: Dolores Frame, MD;  Location: [facility];  Service: Gastroenterology;  Laterality: N/A;  915 ASA 2   ESOPHAGOGASTRODUODENOSCOPY (EGD) WITH PROPOFOL N/A 06/22/2020   Procedure: ESOPHAGOGASTRODUODENOSCOPY (EGD) WITH PROPOFOL;  Surgeon: Dolores Frame, MD;  Location: [facility];  Service: Gastroenterology;  Laterality: N/A;   POLYPECTOMY  06/22/2020   Procedure: POLYPECTOMY;  Surgeon: Dolores Frame, MD;  Location: [facility];  Service: Gastroenterology;;  gastric   POLYPECTOMY  06/22/2020   Procedure: POLYPECTOMY INTESTINAL;  Surgeon: Dolores Frame, MD;  Location: [facility];  Service: Gastroenterology;;  colon   Social History   Occupational History   Not on file  Tobacco Use   Smoking status: Former    Current packs/day: 0.00    Types: Cigarettes    Quit date: 1989    Years since quitting: 35.8    Passive exposure: Never   Smokeless tobacco: Never   Tobacco comments:    Quit 35 years ago  Vaping Use   Vaping status: Never Used  Substance and Sexual Activity   Alcohol use: Never   Drug use: Never   Sexual activity: Not on file

## 2023-02-19 ENCOUNTER — Encounter (HOSPITAL_COMMUNITY): Payer: Self-pay

## 2023-02-19 ENCOUNTER — Emergency Department (HOSPITAL_COMMUNITY)
Admission: EM | Admit: 2023-02-19 | Discharge: 2023-02-19 | Disposition: A | Discharge status: Home / Self Care | Payer: Medicare Other | Attending: Emergency Medicine | Admitting: Emergency Medicine

## 2023-02-19 ENCOUNTER — Emergency Department (HOSPITAL_COMMUNITY): Payer: Medicare Other

## 2023-02-19 ENCOUNTER — Other Ambulatory Visit: Payer: Self-pay

## 2023-02-19 DIAGNOSIS — M79661 Pain in right lower leg: Secondary | ICD-10-CM | POA: Diagnosis not present

## 2023-02-19 DIAGNOSIS — M79604 Pain in right leg: Secondary | ICD-10-CM

## 2023-02-19 DIAGNOSIS — Z7901 Long term (current) use of anticoagulants: Secondary | ICD-10-CM | POA: Diagnosis not present

## 2023-02-19 DIAGNOSIS — Z043 Encounter for examination and observation following other accident: Secondary | ICD-10-CM | POA: Diagnosis not present

## 2023-02-19 DIAGNOSIS — Z86718 Personal history of other venous thrombosis and embolism: Secondary | ICD-10-CM | POA: Diagnosis not present

## 2023-02-19 NOTE — ED Notes (Signed)
Introduced self to pt Pt stated she fell 9 days ago and sprained RIGHT ankle Pt now has pain behind RIGHT knee.   No bruising or swelling noted to knee.  XRAY order placed  Pt stated she has hx of DVT in RIGHT leg.  Spoke to MD, verbal order for [place] orders placed.

## 2023-02-19 NOTE — ED Notes (Signed)
Pt ambulated to and from restroom  Noted a slight limb as pt was walking  UA left on counter, no order at this time

## 2023-02-19 NOTE — ED Triage Notes (Signed)
C/o pain behind right knee radiating down to ankle.  Pt reports fall 9 days ago and saw dr Ophelia Charter and told she had sprained right ankle.  Hx of DVT and PE.  Pt currently on eliquis.

## 2023-02-19 NOTE — ED Notes (Signed)
XRAY completed  US at bedside

## 2023-02-19 NOTE — ED Notes (Signed)
Pt pain free laying Standing pain 7/10 Vitals listed

## 2023-02-19 NOTE — ED Provider Notes (Signed)
[place] EMERGENCY DEPARTMENT AT [facility] Provider Note   CSN: [id number] Arrival date & time: 02/19/23  1131     History  Chief Complaint  Patient presents with   Leg Pain    Bianca Vasquez is a 72 y.o. female.  Pt complains of pain in her ankle and pain in her right calf.  Patient reports that she has a history of a DVT.  Patient is on Eliquis.  Patient reports she has not missed any dosages.  Patient reports that she sprained her ankle 9 days ago.  Patient has a boot but she is not wearing it.  The history is provided by the patient. No language interpreter was used.  Leg Pain Location:  Leg Time since incident:  9 days Injury: yes   Leg location:  R leg Pain details:    Quality:  Aching   Radiates to:  Does not radiate   Severity:  Moderate   Onset quality:  Gradual   Progression:  Worsening Chronicity:  New Foreign body present:  No foreign bodies Relieved by:  Nothing Worsened by:  Nothing Ineffective treatments:  None tried Risk factors: no concern for non-accidental trauma        Home Medications Prior to Admission medications   Medication Sig Start Date End Date Taking? Authorizing Provider  acetaminophen (TYLENOL) 500 MG tablet Take 2 tablets (1,000 mg total) by mouth every 8 (eight) hours as needed for mild pain, moderate pain, fever or headache. 12/24/22   Vassie Loll, MD  amitriptyline (ELAVIL) 25 MG tablet Take 25 mg by mouth at bedtime.    [provider]  APIXABAN Everlene Balls) VTE STARTER PACK (10MG  AND 5MG ) Take as directed on package: start with two-5mg  tablets twice daily for 7 days. On day 8, switch to one-5mg  tablet twice daily. 12/24/22   Vassie Loll, MD  Apoaequorin 10 MG CAPS Take 10 mg by mouth daily at 6 (six) AM.    [provider]  atorvastatin (LIPITOR) 10 MG tablet Take 10 mg by mouth daily.    [provider]  Biotin 5000 MCG TABS Take 5,000 mcg by mouth daily.    [provider]   buPROPion (WELLBUTRIN SR) 150 MG 12 hr tablet Take 150 mg by mouth 2 (two) times daily.    [provider]  CRANBERRY PO Take 1 capsule by mouth daily.    [provider]  cyanocobalamin (VITAMIN B12) 1000 MCG tablet Take 2,500 mcg by mouth daily.    [provider]  famotidine (PEPCID) 20 MG tablet Take 20 mg by mouth at bedtime.    [provider]  levothyroxine (SYNTHROID) 100 MCG tablet Take 100 mcg by mouth daily before breakfast.    [provider]  loratadine (CLARITIN) 10 MG tablet Take 10 mg by mouth daily.    [provider]  magnesium oxide (MAG-OX) 400 (240 Mg) MG tablet Take 400 mg by mouth daily.    [provider]  montelukast (SINGULAIR) 10 MG tablet Take 10 mg by mouth at bedtime.    [provider]  pantoprazole (PROTONIX) 40 MG tablet TAKE 1 TABLET BY MOUTH DAILY 01/18/23   Nyoka Cowden, MD  sertraline (ZOLOFT) 100 MG tablet Take 100 mg by mouth daily.    [provider]  Wheat Dextrin (BENEFIBER) POWD Take 1 Scoop by mouth in the morning and at bedtime.    [provider]      Allergies  Augmentin [amoxicillin-pot clavulanate] and Amoxicillin    Review of Systems   Review of Systems  All other systems reviewed and are negative.   Physical Exam Updated Vital Signs BP (!) 168/68   Pulse 66   Temp 98.7 F (37.1 C) (Oral)   Resp 16   Ht 5\' 5"  (1.651 m)   Wt 81.2 kg   SpO2 97%   BMI 29.79 kg/m  Physical Exam Vitals reviewed.  Constitutional:      Appearance: Normal appearance.  Musculoskeletal:        General: Swelling and tenderness present.     Comments: Tender right calf, pain with Homans,swelling left ankle,  nv and ns intact   Skin:    General: Skin is warm.  Neurological:     General: No focal deficit present.     Mental Status: She is alert.     ED Results / Procedures / Treatments   Labs (all labs ordered are listed, but only abnormal results are  displayed) Labs Reviewed - No data to display  EKG None  Radiology DG Knee Complete 4 Views Right  Result Date: 02/19/2023 CLINICAL DATA:  fall EXAM: RIGHT KNEE - COMPLETE 4+ VIEW COMPARISON:  RIGHT lower extremity venous duplex, concurrent. FINDINGS: No evidence of fracture, dislocation, or joint effusion. Small quadriceps tendon enthesophyte. No evidence of arthropathy or other focal bone abnormality. Soft tissues are unremarkable. IMPRESSION: No acute displaced fracture, dislocation or significant degenerative change within the RIGHT knee. Electronically Signed   By: Roanna Banning M.D.   On: 02/19/2023 13:54   US Venous Img Lower Unilateral Right  Result Date: 02/19/2023 CLINICAL DATA:  DVT history, increased pain EXAM: RIGHT LOWER EXTREMITY VENOUS DOPPLER ULTRASOUND TECHNIQUE: Gray-scale sonography with compression, as well as color and duplex ultrasound, were performed to evaluate the deep venous system(s) from the level of the common femoral vein through the popliteal and proximal calf veins. COMPARISON:  RIGHT lower extremity XRs, concurrent. RIGHT lower extremity venous duplex, 12/23/2022. FINDINGS: VENOUS Normal compressibility of the common femoral, superficial femoral, and popliteal veins, as well as the visualized calf veins. Visualized portions of profunda femoral vein and great saphenous vein unremarkable. No filling defects to suggest DVT on grayscale or color Doppler imaging. Doppler waveforms show normal direction of venous flow, normal respiratory plasticity and response to augmentation. Limited views of the contralateral common femoral vein are unremarkable. OTHER No evidence of superficial thrombophlebitis or abnormal fluid collection. Limitations: none IMPRESSION: No evidence of femoropopliteal DVT or superficial thrombophlebitis within the RIGHT lower extremity. Roanna Banning, MD [facility] Radiology Electronically Signed   By: Roanna Banning M.D.   On: 02/19/2023 13:39    Procedures Procedures    Medications Ordered in ED Medications - No data to display  ED Course/ Medical Decision Making/ A&P                                 Medical Decision Making Patient complains of swelling in her right calf.  Patient sprained her ankle 9 days ago.  Patient is concerned that she has a blood clot from not moving her leg.  Patient is taking Eliquis  Amount and/or Complexity of Data Reviewed Independent Historian: spouse    Details: Patient is here with her husband who is supportive Radiology: ordered and independent interpretation performed. Decision-making details documented in ED Course.    Details: X-ray right knee shows  no acute changes Ultrasound right lower extremity shows no evidence of DVT.  Risk Risk Details: Patient counseled on results.  Patient reports brace is uncomfortable patient is fitted for a cam walker.  She is advised to follow-up with her physician for recheck.           Final Clinical Impression(s) / ED Diagnoses Final diagnoses:  Right leg pain    Rx / DC Orders ED Discharge Orders     None      An After Visit Summary was printed and given to the patient.    Elson Areas, PA-C 02/19/23 1655    Tanda Rockers A, DO 02/20/23 1719

## 2023-02-19 NOTE — Discharge Instructions (Signed)
Return if any problems.  See your Physician as scheduled  

## 2023-02-27 DIAGNOSIS — G4700 Insomnia, unspecified: Secondary | ICD-10-CM | POA: Diagnosis not present

## 2023-02-27 DIAGNOSIS — I82409 Acute embolism and thrombosis of unspecified deep veins of unspecified lower extremity: Secondary | ICD-10-CM | POA: Diagnosis not present

## 2023-02-27 DIAGNOSIS — I700 Atherosclerosis of aorta: Secondary | ICD-10-CM | POA: Diagnosis not present

## 2023-02-27 DIAGNOSIS — I10 Essential (primary) hypertension: Secondary | ICD-10-CM | POA: Diagnosis not present

## 2023-02-27 DIAGNOSIS — I2699 Other pulmonary embolism without acute cor pulmonale: Secondary | ICD-10-CM | POA: Diagnosis not present

## 2023-03-01 DIAGNOSIS — M170 Bilateral primary osteoarthritis of knee: Secondary | ICD-10-CM | POA: Diagnosis not present

## 2023-03-01 DIAGNOSIS — X58XXXD Exposure to other specified factors, subsequent encounter: Secondary | ICD-10-CM | POA: Diagnosis not present

## 2023-03-01 DIAGNOSIS — S99911D Unspecified injury of right ankle, subsequent encounter: Secondary | ICD-10-CM | POA: Diagnosis not present

## 2023-03-06 DIAGNOSIS — Z23 Encounter for immunization: Secondary | ICD-10-CM | POA: Diagnosis not present

## 2023-03-09 DIAGNOSIS — Z299 Encounter for prophylactic measures, unspecified: Secondary | ICD-10-CM | POA: Diagnosis not present

## 2023-03-09 DIAGNOSIS — R319 Hematuria, unspecified: Secondary | ICD-10-CM | POA: Diagnosis not present

## 2023-03-09 DIAGNOSIS — I700 Atherosclerosis of aorta: Secondary | ICD-10-CM | POA: Diagnosis not present

## 2023-03-15 ENCOUNTER — Ambulatory Visit (INDEPENDENT_AMBULATORY_CARE_PROVIDER_SITE_OTHER): Payer: Medicare Other | Admitting: Gastroenterology

## 2023-03-28 ENCOUNTER — Encounter (INDEPENDENT_AMBULATORY_CARE_PROVIDER_SITE_OTHER): Payer: Self-pay | Admitting: Gastroenterology

## 2023-03-29 DIAGNOSIS — H43812 Vitreous degeneration, left eye: Secondary | ICD-10-CM | POA: Diagnosis not present

## 2023-03-29 DIAGNOSIS — H1045 Other chronic allergic conjunctivitis: Secondary | ICD-10-CM | POA: Diagnosis not present

## 2023-03-29 DIAGNOSIS — H25813 Combined forms of age-related cataract, bilateral: Secondary | ICD-10-CM | POA: Diagnosis not present

## 2023-03-29 DIAGNOSIS — H43811 Vitreous degeneration, right eye: Secondary | ICD-10-CM | POA: Diagnosis not present

## 2023-04-12 DIAGNOSIS — S8991XD Unspecified injury of right lower leg, subsequent encounter: Secondary | ICD-10-CM | POA: Diagnosis not present

## 2023-04-12 DIAGNOSIS — M25561 Pain in right knee: Secondary | ICD-10-CM | POA: Diagnosis not present

## 2023-04-12 DIAGNOSIS — R29898 Other symptoms and signs involving the musculoskeletal system: Secondary | ICD-10-CM | POA: Diagnosis not present

## 2023-04-12 DIAGNOSIS — G8929 Other chronic pain: Secondary | ICD-10-CM | POA: Diagnosis not present

## 2023-04-12 DIAGNOSIS — M2391 Unspecified internal derangement of right knee: Secondary | ICD-10-CM | POA: Diagnosis not present

## 2023-04-12 DIAGNOSIS — M25562 Pain in left knee: Secondary | ICD-10-CM | POA: Diagnosis not present

## 2023-04-12 DIAGNOSIS — M170 Bilateral primary osteoarthritis of knee: Secondary | ICD-10-CM | POA: Diagnosis not present

## 2023-04-24 ENCOUNTER — Ambulatory Visit (INDEPENDENT_AMBULATORY_CARE_PROVIDER_SITE_OTHER): Payer: Medicare Other | Admitting: Gastroenterology

## 2023-04-26 DIAGNOSIS — M660 Rupture of popliteal cyst: Secondary | ICD-10-CM | POA: Diagnosis not present

## 2023-04-26 DIAGNOSIS — R29898 Other symptoms and signs involving the musculoskeletal system: Secondary | ICD-10-CM | POA: Diagnosis not present

## 2023-04-26 DIAGNOSIS — G8929 Other chronic pain: Secondary | ICD-10-CM | POA: Diagnosis not present

## 2023-04-26 DIAGNOSIS — M7121 Synovial cyst of popliteal space [Baker], right knee: Secondary | ICD-10-CM | POA: Diagnosis not present

## 2023-04-26 DIAGNOSIS — S8991XD Unspecified injury of right lower leg, subsequent encounter: Secondary | ICD-10-CM | POA: Diagnosis not present

## 2023-04-26 DIAGNOSIS — M25561 Pain in right knee: Secondary | ICD-10-CM | POA: Diagnosis not present

## 2023-04-26 DIAGNOSIS — M25461 Effusion, right knee: Secondary | ICD-10-CM | POA: Diagnosis not present

## 2023-04-26 DIAGNOSIS — M222X1 Patellofemoral disorders, right knee: Secondary | ICD-10-CM | POA: Diagnosis not present

## 2023-05-02 DIAGNOSIS — M1711 Unilateral primary osteoarthritis, right knee: Secondary | ICD-10-CM | POA: Diagnosis not present

## 2023-05-02 DIAGNOSIS — M222X1 Patellofemoral disorders, right knee: Secondary | ICD-10-CM | POA: Diagnosis not present

## 2023-05-02 DIAGNOSIS — M7121 Synovial cyst of popliteal space [Baker], right knee: Secondary | ICD-10-CM | POA: Diagnosis not present

## 2023-05-02 DIAGNOSIS — M25562 Pain in left knee: Secondary | ICD-10-CM | POA: Diagnosis not present

## 2023-05-02 DIAGNOSIS — S8991XD Unspecified injury of right lower leg, subsequent encounter: Secondary | ICD-10-CM | POA: Diagnosis not present

## 2023-05-02 DIAGNOSIS — M222X2 Patellofemoral disorders, left knee: Secondary | ICD-10-CM | POA: Diagnosis not present

## 2023-05-02 DIAGNOSIS — G8929 Other chronic pain: Secondary | ICD-10-CM | POA: Diagnosis not present

## 2023-05-02 DIAGNOSIS — M25561 Pain in right knee: Secondary | ICD-10-CM | POA: Diagnosis not present

## 2023-05-10 DIAGNOSIS — M7121 Synovial cyst of popliteal space [Baker], right knee: Secondary | ICD-10-CM | POA: Diagnosis not present

## 2023-05-10 DIAGNOSIS — M25561 Pain in right knee: Secondary | ICD-10-CM | POA: Diagnosis not present

## 2023-05-15 ENCOUNTER — Encounter (INDEPENDENT_AMBULATORY_CARE_PROVIDER_SITE_OTHER): Payer: Self-pay | Admitting: Gastroenterology

## 2023-05-15 ENCOUNTER — Ambulatory Visit (HOSPITAL_COMMUNITY)
Admission: RE | Admit: 2023-05-15 | Discharge: 2023-05-15 | Disposition: A | Discharge status: Home / Self Care | Payer: Medicare Other | Source: Ambulatory Visit | Attending: Gastroenterology | Admitting: Gastroenterology

## 2023-05-15 ENCOUNTER — Ambulatory Visit (INDEPENDENT_AMBULATORY_CARE_PROVIDER_SITE_OTHER): Payer: Medicare Other | Admitting: Gastroenterology

## 2023-05-15 VITALS — BP 127/83 | HR 66 | Temp 97.1°F | Ht 65.0 in | Wt 167.2 lb

## 2023-05-15 DIAGNOSIS — R1032 Left lower quadrant pain: Secondary | ICD-10-CM | POA: Diagnosis not present

## 2023-05-15 DIAGNOSIS — R634 Abnormal weight loss: Secondary | ICD-10-CM | POA: Insufficient documentation

## 2023-05-15 DIAGNOSIS — R197 Diarrhea, unspecified: Secondary | ICD-10-CM

## 2023-05-15 DIAGNOSIS — Z87891 Personal history of nicotine dependence: Secondary | ICD-10-CM | POA: Diagnosis not present

## 2023-05-15 DIAGNOSIS — R159 Full incontinence of feces: Secondary | ICD-10-CM

## 2023-05-15 DIAGNOSIS — Z9049 Acquired absence of other specified parts of digestive tract: Secondary | ICD-10-CM | POA: Diagnosis not present

## 2023-05-15 DIAGNOSIS — Z90710 Acquired absence of both cervix and uterus: Secondary | ICD-10-CM | POA: Diagnosis not present

## 2023-05-15 DIAGNOSIS — Z85038 Personal history of other malignant neoplasm of large intestine: Secondary | ICD-10-CM

## 2023-05-15 DIAGNOSIS — K219 Gastro-esophageal reflux disease without esophagitis: Secondary | ICD-10-CM | POA: Diagnosis not present

## 2023-05-15 DIAGNOSIS — K7689 Other specified diseases of liver: Secondary | ICD-10-CM | POA: Diagnosis not present

## 2023-05-15 LAB — POCT I-STAT CREATININE: Creatinine, Ser: 0.9 mg/dL (ref 0.44–1.00)

## 2023-05-15 MED ORDER — IOHEXOL 300 MG/ML  SOLN
100.0000 mL | Freq: Once | INTRAMUSCULAR | Status: AC | PRN
  Administered 2023-05-15: 100 mL via INTRAVENOUS

## 2023-05-15 MED ORDER — IOHEXOL 300 MG/ML  SOLN
30.0000 mL | Freq: Once | INTRAMUSCULAR | Status: AC | PRN
  Administered 2023-05-15: 30 mL via ORAL

## 2023-05-15 NOTE — Progress Notes (Addendum)
Referring Provider: Wendall Papa, NP Primary Care Physician:  Wendall Papa Primary GI Physician: Dr. Levon Hedger   Chief Complaint  Patient presents with   Follow-up    Patient here today for a follow up. She says she is still having issues with loose stools. She has 1-4 stools per day, that start out normal, and then they become loose. She is taking benefiber bid. Bianca Vasquez is controlled on Pantoprazole 40 mg daily and famotidine 20 mg at bedtime.  Patient has no appetite, and is losing weight unintentional.   HPI:   [name] is a 73 y.o. female with past medical history of depression, hypothyroidism, GERD, HLD, colon cancer s/p ileocolectomy   Patient presenting today for follow up of diarrhea and fecal incontinence  Last seen August 2024, at that time having large-volume episodes of fecal incontinence and diarrhea.  Having some formed stools in between.  Symptoms having a bowel movement every time she eats.  Occasional lower abdominal pain.  Imodium helps some.  Did report taking more magnesium recently and recent course of antibiotics.  GERD is well-controlled on Protonix 40 mg daily  Recommend check TSH, pancreatic elastase, fecal fat, start Benefiber 1 tablespoon twice daily to 3 times daily, continue probiotic consider cholestyramine if Benefiber not improving symptoms, food/stool journal, be mindful of magnesium for worsening diarrhea.  TSH in August 2024 was 4.05 Pancreatic elastase and fecal fat in September 2024 were within normal limits  Present: Feeling much better since starting benefiber. She notes that only a few times since last visit, she has had fecal incontinence. Has mostly liquid stools, occasionally will have a solid stool. Usually having 1 BM per day though can have up to 3. Feels maybe 45% of her stools are solid, the others are more like syrup consistency. She is doing 1T benefiber BID.  She has some intermittent, occasional abdominal pain,  sometimes on RUQ, sometimes more Left mid to lower quadrant. She feels pretty good most days. Does not she stopped taking some of the vitamins she was taking and is feeling better. Feels that appetite is lower than previously, sometimes will skip breakfast, do a small lunch and then does a good dinner.  Denies nausea, vomiting, melena. She notes that she did have one day where she passed a blood clot from her rectum which she saw in the toilet, but saw her PCP and was told it was likely related to her Eliquis. She notes that she had no abdominal pain at that time, no blood on toilet tissue. This occurred maybe September/October time frame. She does note that eating out seems to make a difference in her stools as well.   She has lost some weight but also notes that her husband went on a low carb, low sugar diet, she has done more low carbs like he is doing but is still eating sugar.   GERD well controlled on prootnix 40mg  daily and famotidine at bedtime   Last Colonoscopy:11/2021 - The examined portion of the ileum was normal.                           - Patent end-to-side ileo-colonic anastomosis,                            characterized by healthy appearing mucosa.                           -  The distal rectum and anal verge are normal on                            retroflexion view.                           - No specimens collected. Last Endoscopy:06/2020 - Normal esophagus.                           - Multiple gastric polyps. Resected and retrieved.                           - Nodular mucosa in the second portion of the                            duodenum. Biopsied. No celiac, duodenal TA, fundic gland polyps                            - Normal rest of the examined duodenum. Biopsied.   Past Medical History:  Diagnosis Date   Anemia    Cancer (HCC)    colon   Cancer of skin of external nose    Depression    GERD (gastroesophageal reflux disease)    High cholesterol    Hypothyroidism      Past Surgical History:  Procedure Laterality Date   ABDOMINAL HYSTERECTOMY     total   BIOPSY  06/22/2020   Procedure: BIOPSY;  Surgeon: Dolores Frame, MD;  [facility];  Service: Gastroenterology;;  duodenum   CHOLECYSTECTOMY     COLECTOMY  07/2020   COLONOSCOPY     COLONOSCOPY WITH PROPOFOL N/A 06/22/2020   Procedure: COLONOSCOPY WITH PROPOFOL;  Surgeon: Dolores Frame, MD;  [facility]: [facility];  Service: Gastroenterology;  Laterality: N/A;  am   COLONOSCOPY WITH PROPOFOL N/A 11/29/2021   Procedure: COLONOSCOPY WITH PROPOFOL;  Surgeon: Dolores Frame, MD;  [facility];  Service: Gastroenterology;  Laterality: N/A;  915 ASA 2   ESOPHAGOGASTRODUODENOSCOPY (EGD) WITH PROPOFOL N/A 06/22/2020   Procedure: ESOPHAGOGASTRODUODENOSCOPY (EGD) WITH PROPOFOL;  Surgeon: Dolores Frame, MD;  [facility];  Service: Gastroenterology;  Laterality: N/A;   POLYPECTOMY  06/22/2020   Procedure: POLYPECTOMY;  Surgeon: Dolores Frame, MD;  [facility];  Service: Gastroenterology;;  gastric   POLYPECTOMY  06/22/2020   Procedure: POLYPECTOMY INTESTINAL;  Surgeon: Marguerita Merles, Reuel Boom, MD;  [facility]: [facility];  Service: Gastroenterology;;  colon    Current Outpatient Medications  Medication Sig Dispense Refill   acetaminophen (TYLENOL) 500 MG tablet Take 2 tablets (1,000 mg total) by mouth every 8 (eight) hours as needed for mild pain, moderate pain, fever or headache.     amitriptyline (ELAVIL) 10 MG tablet Take 10 mg by mouth at bedtime.     APIXABAN (ELIQUIS) VTE STARTER PACK (10MG  AND 5MG ) Take as directed on package: start with two-5mg  tablets twice daily for 7 days. On day 8, switch to one-5mg  tablet twice daily. (Patient taking differently: 5mg  tablet twice daily.) 74 each 0   Apoaequorin 10 MG CAPS Take 10 mg by mouth daily at 6 (six) AM.     atorvastatin (LIPITOR) 10 MG tablet Take 10  mg  by mouth daily.     CRANBERRY PO Take 2 capsules by mouth daily.     famotidine (PEPCID) 20 MG tablet Take 20 mg by mouth at bedtime.     levothyroxine (SYNTHROID) 100 MCG tablet Take 100 mcg by mouth daily before breakfast.     loratadine (CLARITIN) 10 MG tablet Take 10 mg by mouth daily.     montelukast (SINGULAIR) 10 MG tablet Take 10 mg by mouth at bedtime.     nitrofurantoin (MACRODANTIN) 100 MG capsule Take 100 mg by mouth daily at 6 (six) AM.     pantoprazole (PROTONIX) 40 MG tablet TAKE 1 TABLET BY MOUTH DAILY 90 tablet 1   sertraline (ZOLOFT) 100 MG tablet Take 100 mg by mouth daily.     Wheat Dextrin (BENEFIBER) POWD Take 1 Scoop by mouth in the morning and at bedtime.     No current facility-administered medications for this visit.    Allergies as of 05/15/2023 - Review Complete 05/15/2023  Allergen Reaction Noted   Augmentin [amoxicillin-pot clavulanate] Nausea And Vomiting 06/03/2020   Amoxicillin Nausea And Vomiting and Other (See Comments) 02/20/2022    Family History  Problem Relation Age of Onset   COPD Mother    COPD Father    Heart disease Sister    Kidney disease Sister    Fibromyalgia Sister    Migraines Sister    Emphysema Sister    Healthy Brother    Cancer - Lung Maternal Aunt    Cancer Maternal Uncle     Social History   Socioeconomic History   Marital status: Married    Spouse name: Not on file   Number of children: Not on file   Years of education: Not on file   Highest education level: Not on file  Occupational History   Not on file  Tobacco Use   Smoking status: Former    Current packs/day: 0.00    Types: Cigarettes    Quit date: 1989    Years since quitting: 36.0    Passive exposure: Never   Smokeless tobacco: Never   Tobacco comments:    Quit 35 years ago  Vaping Use   Vaping status: Never Used  Substance and Sexual Activity   Alcohol use: Never   Drug use: Never   Sexual activity: Not on file  Other Topics Concern   Not  on file  Social History Narrative   Not on file   Social Drivers of Health   Financial Resource Strain: Low Risk  (08/10/2020)   Received from [facility], [facility]   Overall Financial Resource Strain (CARDIA)    Difficulty of Paying Living Expenses: Not hard at all  Food Insecurity: No Food Insecurity (12/23/2022)   Hunger Vital Sign    Worried About Running Out of Food in the Last Year: Never true    Ran Out of Food in the Last Year: Never true  Transportation Needs: No Transportation Needs (12/23/2022)   PRAPARE - Administrator, Civil Service (Medical): No    Lack of Transportation (Non-Medical): No  Physical Activity: Not on file  Stress: No Stress Concern Present (08/10/2020)   Received from [facility], [facility] - Occupational Stress Questionnaire    Feeling of Stress : Not at all  Social Connections: Unknown (08/21/2021)   Received from [facility], [facility]   Social Network    Social Network: Not on file    Review  of systems General: negative for malaise, night sweats, fever, chills +weight loss  Neck: Negative for lumps, goiter, pain and significant neck swelling Resp: Negative for cough, wheezing, dyspnea at rest CV: Negative for chest pain, leg swelling, palpitations, orthopnea GI: denies melena, nausea, vomiting, constipation, dysphagia, odyonophagia, early satiety +weight loss +diarrhea +left lower abdominal pain +passage of blood clot per rectum MSK: Negative for joint pain or swelling, back pain, and muscle pain. Derm: Negative for itching or rash Psych: Denies depression, anxiety, memory loss, confusion. No homicidal or suicidal ideation.  Heme: Negative for prolonged bleeding, bruising easily, and swollen nodes. Endocrine: Negative for cold or heat intolerance, polyuria, polydipsia and goiter. Neuro: negative for tremor, gait imbalance, syncope and seizures. The remainder of the review of  systems is noncontributory.  Physical Exam: BP 127/83 (BP Location: Left Arm, Patient Position: Sitting, Cuff Size: Large)   Pulse 66   Temp (!) 97.1 F (36.2 C) (Temporal)   Ht 5\' 5"  (1.651 m)   Wt 167 lb 3.2 oz (75.8 kg)   BMI 27.82 kg/m  General:   Alert and oriented. No distress noted. Pleasant and cooperative.  Head:  Normocephalic and atraumatic. Eyes:  Conjuctiva clear without scleral icterus. Mouth:  Oral mucosa pink and moist. Good dentition. No lesions. Heart: Normal rate and rhythm, s1 and s2 heart sounds present.  Lungs: Clear lung sounds in all lobes. Respirations equal and unlabored. Abdomen:  +BS, soft, and non-distended. TTP of mid to lower left abdomen. No rebound or guarding. No HSM or masses noted. Derm: No palmar erythema or jaundice Msk:  Symmetrical without gross deformities. Normal posture. Extremities:  Without edema. Neurologic:  Alert and  oriented x4 Psych:  Alert and cooperative. Normal mood and affect.  Invalid input(s): "6 MONTHS"   ASSESSMENT: Bianca Vasquez is a 73 y.o. female presenting today for follow up of diarrhea  Patient with history of colon carcinoma in 2022 status post ileocolectomy who presents with some ongoing looser stools since her surgery.  Last colonoscopy in August 2023 mostly unremarkable.  She has had some improvement in fecal incontinence with use of Benefiber that has had about 20 pounds weight loss since August.  Does endorse some mild changes in her diet, denies any other medication changes specifically to include her thyroid medicine.  TSH in August was within normal limits.  She had 1 episode of a blood clot that she passed per rectum around September or October but no further rectal bleeding.  She is on Eliquis.  She does endorse some very occasional mid to left lower abdominal pain with some mild tenderness on exam today.  Patient has declined if undergo any further colonoscopies, however would recommend proceeding with  CT abdomen pelvis for further evaluation of her symptoms especially given her history of colon cancer.  For now she continue good water intake, can increase Benefiber to 1 tablespoon 3 times daily and will proceed with CT further evaluation of her symptoms.  GERD well-managed with Protonix 40 mg daily famotidine 20 mg nightly, will continue with current regimen.   PLAN:  -continue good water intake -continue benefiber, can try increasing to 1T TID with meals -Continue Protonix 40 mg daily -Continue famotidine 20 mg nightly -CT abdomen pelvis with contrast -Patient to make me aware of any further rectal bleeding  All questions were answered, patient verbalized understanding and is in agreement with plan as outlined above.   Follow Up: 2 months   Dimples Probus L. Jeanmarie Hubert, MSN, APRN, AGNP-C  Adult-Gerontology Nurse Practitioner [facility]  I have reviewed the note and agree with the APP's assessment as described in this progress note  Katrinka Blazing, MD Gastroenterology and Hepatology [facility]

## 2023-05-15 NOTE — Patient Instructions (Signed)
Please continue with good water intake and benefiber, you can even try increasing benefiber to 1T 3 times per day We will get you scheduled for CT of your abdomen If you have any further rectal bleeding, please make me aware Continue with protonix 40mg  daily and famotidine 20mg  at bedtime   Follow up 2 months  It was a pleasure to see you today. I want to create trusting relationships with patients and provide genuine, compassionate, and quality care. I truly value your feedback! please be on the lookout for a survey regarding your visit with me today. I appreciate your input about our visit and your time in completing this!    Khala Tarte L. Jeanmarie Hubert, MSN, APRN, AGNP-C Adult-Gerontology Nurse Practitioner [facility]astroenterology at [facility]

## 2023-05-17 ENCOUNTER — Telehealth (INDEPENDENT_AMBULATORY_CARE_PROVIDER_SITE_OTHER): Payer: Self-pay | Admitting: *Deleted

## 2023-05-17 NOTE — Telephone Encounter (Signed)
Patient called to get results of ct scan. I called her back and let her know it is not read yet and it is usually resulted in 1 -2 weeks. She verbalized understanding.

## 2023-07-10 ENCOUNTER — Ambulatory Visit (INDEPENDENT_AMBULATORY_CARE_PROVIDER_SITE_OTHER): Payer: Medicare Other | Admitting: Gastroenterology

## 2023-07-12 DIAGNOSIS — M222X1 Patellofemoral disorders, right knee: Secondary | ICD-10-CM | POA: Diagnosis not present

## 2023-07-12 DIAGNOSIS — M222X2 Patellofemoral disorders, left knee: Secondary | ICD-10-CM | POA: Diagnosis not present

## 2023-07-12 DIAGNOSIS — M170 Bilateral primary osteoarthritis of knee: Secondary | ICD-10-CM | POA: Diagnosis not present

## 2023-07-12 DIAGNOSIS — M7121 Synovial cyst of popliteal space [Baker], right knee: Secondary | ICD-10-CM | POA: Diagnosis not present

## 2023-07-12 DIAGNOSIS — G8929 Other chronic pain: Secondary | ICD-10-CM | POA: Diagnosis not present

## 2023-07-16 ENCOUNTER — Other Ambulatory Visit: Payer: Self-pay | Admitting: Internal Medicine

## 2023-08-02 DIAGNOSIS — Z299 Encounter for prophylactic measures, unspecified: Secondary | ICD-10-CM | POA: Diagnosis not present

## 2023-08-02 DIAGNOSIS — Z79899 Other long term (current) drug therapy: Secondary | ICD-10-CM | POA: Diagnosis not present

## 2023-08-02 DIAGNOSIS — Z7189 Other specified counseling: Secondary | ICD-10-CM | POA: Diagnosis not present

## 2023-08-02 DIAGNOSIS — I10 Essential (primary) hypertension: Secondary | ICD-10-CM | POA: Diagnosis not present

## 2023-08-02 DIAGNOSIS — Z0000 Encounter for general adult medical examination without abnormal findings: Secondary | ICD-10-CM | POA: Diagnosis not present

## 2023-08-02 DIAGNOSIS — R5383 Other fatigue: Secondary | ICD-10-CM | POA: Diagnosis not present

## 2023-08-02 DIAGNOSIS — E7800 Pure hypercholesterolemia, unspecified: Secondary | ICD-10-CM | POA: Diagnosis not present

## 2023-08-02 DIAGNOSIS — F3342 Major depressive disorder, recurrent, in full remission: Secondary | ICD-10-CM | POA: Diagnosis not present

## 2023-08-02 DIAGNOSIS — E039 Hypothyroidism, unspecified: Secondary | ICD-10-CM | POA: Diagnosis not present

## 2023-08-02 DIAGNOSIS — Z1339 Encounter for screening examination for other mental health and behavioral disorders: Secondary | ICD-10-CM | POA: Diagnosis not present

## 2023-08-02 DIAGNOSIS — M8580 Other specified disorders of bone density and structure, unspecified site: Secondary | ICD-10-CM | POA: Diagnosis not present

## 2023-08-02 DIAGNOSIS — Z1331 Encounter for screening for depression: Secondary | ICD-10-CM | POA: Diagnosis not present

## 2023-08-21 DIAGNOSIS — F3342 Major depressive disorder, recurrent, in full remission: Secondary | ICD-10-CM | POA: Diagnosis not present

## 2023-08-21 DIAGNOSIS — I700 Atherosclerosis of aorta: Secondary | ICD-10-CM | POA: Diagnosis not present

## 2023-08-21 DIAGNOSIS — Z299 Encounter for prophylactic measures, unspecified: Secondary | ICD-10-CM | POA: Diagnosis not present

## 2023-08-21 DIAGNOSIS — I10 Essential (primary) hypertension: Secondary | ICD-10-CM | POA: Diagnosis not present

## 2023-08-21 DIAGNOSIS — I82409 Acute embolism and thrombosis of unspecified deep veins of unspecified lower extremity: Secondary | ICD-10-CM | POA: Diagnosis not present

## 2023-08-31 ENCOUNTER — Other Ambulatory Visit: Payer: Self-pay | Admitting: Internal Medicine

## 2023-09-03 ENCOUNTER — Other Ambulatory Visit: Payer: Self-pay | Admitting: Internal Medicine

## 2023-09-03 ENCOUNTER — Telehealth: Payer: Self-pay

## 2023-09-03 NOTE — Telephone Encounter (Signed)
 Copied from CRM [id number]. Topic: Clinical - Medication Refill >> Sep 03, 2023 12:04 PM Ilene Malling wrote: Most Recent Pulmonary Care Visit:   Provider: Dr. Vernestine Gondola Department: [facility] Pulmonary Care Date: 02/06/2022  Medication: pantoprazole  (PROTONIX ) 40 MG tablet  Has the patient contacted their pharmacy? Yes (Agent: If no, request that the patient contact the pharmacy for the refill. If patient does not wish to contact the pharmacy document the reason why and proceed with request.) (Agent: If yes, when and what did the pharmacy advise?)  This is the patient's preferred pharmacy:  [facility] - [place], [place] - [address] [place] [address] Phone: [phone] Fax: [phone]  Is this the correct pharmacy for this prescription? Yes If no, delete pharmacy and type the correct one.   Has the prescription been filled recently? No  Is the patient out of the medication? Yes  Has the patient been seen for an appointment in the last year OR does the patient have an upcoming appointment? Yes. Scheduled an appointment for 10/05/23 at 9:30 am. Patient is out of medication and asking for refills until office visit. Please advise and call back at [phone].   Can we respond through MyChart? No  Agent: Please be advised that Rx refills may take up to 3 business days. We ask that you follow-up with your pharmacy.

## 2023-09-03 NOTE — Telephone Encounter (Signed)
 Copied from CRM [id number]. Topic: Clinical - Prescription Issue >> Sep 03, 2023 11:50 AM Evie Hoff wrote: Reason for CRM: patient is calling because her prescription was denied . Patient phone lost connection . Tried calling patient back no response . Tried 3 times on cell number . Patient was refused for a refill because she needs a appointment . Please get patient scheduled and then send a refill request in . [phone]  ATC x1 Ldvmtcb.  Sending Mychart Routing to front staff to get pt scheduled for ov for further refills.

## 2023-09-04 ENCOUNTER — Other Ambulatory Visit: Payer: Self-pay | Admitting: Internal Medicine

## 2023-09-04 DIAGNOSIS — H25813 Combined forms of age-related cataract, bilateral: Secondary | ICD-10-CM | POA: Diagnosis not present

## 2023-09-04 DIAGNOSIS — H43813 Vitreous degeneration, bilateral: Secondary | ICD-10-CM | POA: Diagnosis not present

## 2023-09-04 DIAGNOSIS — H5203 Hypermetropia, bilateral: Secondary | ICD-10-CM | POA: Diagnosis not present

## 2023-09-04 DIAGNOSIS — H524 Presbyopia: Secondary | ICD-10-CM | POA: Diagnosis not present

## 2023-09-04 DIAGNOSIS — H52223 Regular astigmatism, bilateral: Secondary | ICD-10-CM | POA: Diagnosis not present

## 2023-09-04 DIAGNOSIS — H10413 Chronic giant papillary conjunctivitis, bilateral: Secondary | ICD-10-CM | POA: Diagnosis not present

## 2023-09-07 NOTE — Telephone Encounter (Signed)
 Can Wert double book one day? No avalibility till 8/4.

## 2023-09-14 NOTE — Telephone Encounter (Signed)
 Patient is scheduled with Dr. Waymond Hailey on 10/05/2023 in [place].

## 2023-10-05 ENCOUNTER — Encounter: Payer: Self-pay | Admitting: Internal Medicine

## 2023-10-05 ENCOUNTER — Ambulatory Visit (INDEPENDENT_AMBULATORY_CARE_PROVIDER_SITE_OTHER): Admitting: Internal Medicine

## 2023-10-05 VITALS — BP 126/78 | HR 75 | Temp 97.4°F | Ht 65.0 in | Wt 160.4 lb

## 2023-10-05 DIAGNOSIS — R058 Other specified cough: Secondary | ICD-10-CM | POA: Diagnosis not present

## 2023-10-05 MED ORDER — PANTOPRAZOLE SODIUM 40 MG PO TBEC: 40.0000 mg | DELAYED_RELEASE_TABLET | Freq: Every day | ORAL | 3 refills | Status: AC

## 2023-10-05 NOTE — Patient Instructions (Addendum)
 Resume Pantoprazole  (protonix ) 40 mg   Take  30-60 min before first meal of the day and Pepcid  (famotidine )  20 mg after supper until return to office - this is the best way to tell whether stomach acid is contributing to your problem.    GERD (REFLUX)  is an extremely common cause of respiratory symptoms just like yours , many times with no obvious heartburn at all.    It can be treated with medication, but also with lifestyle changes including elevation of the head of your bed (ideally with 6 -8inch blocks under the headboard of your bed),  Smoking cessation, avoidance of late meals, excessive alcohol , and avoid fatty foods, chocolate, peppermint, colas, red wine, and acidic juices such as orange juice.  NO MINT OR MENTHOL PRODUCTS SO NO COUGH DROPS  USE SUGARLESS CANDY INSTEAD (Jolley ranchers or Stover's or Life Savers) or even ice chips will also do - the key is to swallow to prevent all throat clearing. NO OIL BASED VITAMINS - use powdered substitutes.  Avoid fish oil when coughing.   Zyrtec 10 mg daily instead of clariton (loratadine )     If you are satisfied with your treatment plan,  let your doctor know and he/she can either refill your medications or you can return here when your prescription runs out.     If in any way you are not 100% satisfied,  please tell us .  If 100% better, tell your friends!  Pulmonary follow up is as needed

## 2023-10-05 NOTE — Progress Notes (Unsigned)
 Bianca Vasquez, female    DOB: [birth year]   MRN: [id number]   Brief patient profile:  58  yowf quit smoking 1989 referred to pulmonary clinic 11/01/2021 by Christean Courts NP  for cough p Covid Dec 2022  s/p vax x 2 prior       allergies as child = sinus problem and  as adult allergy shots  in her 80s but sniffed every since     History of Present Illness  11/01/2021  Pulmonary/ 1st office eval/Saatvik Thielman onset  abrupt onset May 2023  with apparent severe viral uri  bad cold with fever chills and 2 neg covid tests  Chief Complaint  Patient presents with   Consult    Pt states she was sent here by Dr Darien Eden. Pt states she has been sick for 2 months with a cough and sore chest.  Dyspnea:  back to baseline  Cough: improved since onset 90%> no excess mucus  Sleep: fine flat now no resp cc  SABA use: nebulizer makes her cough  - last used 8 pm night prior to ov Still has same sniffles as baseline  Last abx for recurrent uti last Dec 2022 none were macrodantin  Energy level not better  is her main residual cc from her URI  Rec GERD diet /bed blocks  Pantoprazole  (protonix ) 40 mg   Take  30-60 min before first meal of the day and Pepcid  (famotidine )  20 mg after supper until return to office    12/06/2021  f/u ov/[place] office/Naelle Diegel re: cough since May 2023 maint on gerd rx   Chief Complaint  Patient presents with   Follow-up    Cough and sob have improved since using GERD regimen   Dyspnea:  no doe  Cough: gone and no longer suppressing it with hard rock candy  Sleeping: electric bed raised 30 degrees SABA use: no inhalers 02: none  Covid status: vax x 2 infected ? 2x  Rec Ok to take Pepcid  as needed once the allergy season is over  If clariton not helping ok to try zyrtec as needed     02/06/2022  Acute ov/Callista Hoh re: recurrent cough  maint on gerd rx / singular  Chief Complaint  Patient presents with   Sore Throat    Sore throat, nose burning, runny nose, cough x  10 days. Some wheezing and dyspnea   Dyspnea:  mostly when cough  Cough: rattling / assoc with watery nasal discharge Sleeping: does settle down @ 30 degree bed  SABA use: none / never helped in past similar situtations 02: none  Covid home testing neg x 2/ no sick contacts Rec Zpak  Prednisone  10 mg take  4 each am x 2 days,   2 each am x 2 days,  1 each am x 2 days and stop  Take delsym two tsp (or mucinex dm 1200 mg) every 12 hours and supplement if needed with  Tylenol  #3    Once you have eliminated the cough for 3 straight days try reducing the Tylenol  #3 first,  then the delsym as tolerated.         10/05/2023  re-establish  ov/[place] office/Gari Hartsell re: recurrent cough  maint on ppi  / singulair  Chief Complaint  Patient presents with   Follow-up    D.O.E and Upper Airway Cough Syndrome f/u   Dyspnea: Not limited by breathing from desired activities   Cough: good control as long as taking protonix  /pepcid  Sleeping: 30 degree HOB/  electric s   resp cc  SABA use: none  02: none      No obvious day to day or daytime variability or assoc excess/ purulent sputum or mucus plugs or hemoptysis or cp or chest tightness, subjective wheeze or overt sinus or hb symptoms.    Also denies any obvious fluctuation of symptoms with weather or environmental changes or other aggravating or alleviating factors except as outlined above   No unusual exposure hx or h/o childhood pna/ asthma or knowledge of premature birth.  Current Allergies, Complete Past Medical History, Past Surgical History, Family History, and Social History were reviewed in [employer] record.  ROS  The following are not active complaints unless bolded Hoarseness, sore throat, dysphagia, dental problems, itching, sneezing,  nasal congestion or discharge of excess mucus or purulent secretions, ear ache,   fever, chills, sweats, unintended wt loss or wt gain, classically pleuritic or exertional cp,   orthopnea pnd or arm/hand swelling  or leg swelling, presyncope, palpitations, abdominal pain, anorexia, nausea, vomiting, diarrhea  or change in bowel habits or change in bladder habits, change in stools or change in urine, dysuria, hematuria,  rash, arthralgias, visual complaints, headache, numbness, weakness or ataxia or problems with walking or coordination,  change in mood or  memory.        Current Meds  Medication Sig   acetaminophen  (TYLENOL ) 500 MG tablet Take 2 tablets (1,000 mg total) by mouth every 8 (eight) hours as needed for mild pain, moderate pain, fever or headache.   amitriptyline  (ELAVIL ) 10 MG tablet Take 10 mg by mouth at bedtime.   APIXABAN  (ELIQUIS ) VTE STARTER PACK (10MG  AND 5MG ) Take as directed on package: start with two-5mg  tablets twice daily for 7 days. On day 8, switch to one-5mg  tablet twice daily. (Patient taking differently: 5mg  tablet twice daily.)   Apoaequorin 10 MG CAPS Take 10 mg by mouth daily at 6 (six) AM.   atorvastatin  (LIPITOR) 10 MG tablet Take 10 mg by mouth daily.   CRANBERRY PO Take 2 capsules by mouth daily.   famotidine  (PEPCID ) 20 MG tablet Take 20 mg by mouth at bedtime.   levothyroxine  (SYNTHROID ) 100 MCG tablet Take 100 mcg by mouth daily before breakfast.   loratadine  (CLARITIN ) 10 MG tablet Take 10 mg by mouth daily.   montelukast  (SINGULAIR ) 10 MG tablet Take 10 mg by mouth at bedtime.   nitrofurantoin (MACRODANTIN) 100 MG capsule Take 100 mg by mouth daily at 6 (six) AM.   pantoprazole  (PROTONIX ) 40 MG tablet TAKE 1 TABLET BY MOUTH DAILY   sertraline  (ZOLOFT ) 100 MG tablet Take 100 mg by mouth daily.   Wheat Dextrin (BENEFIBER) POWD Take 1 Scoop by mouth in the morning and at bedtime.                  Past Medical History:  Diagnosis Date   Anemia    Depression    GERD (gastroesophageal reflux disease)    Hypothyroidism        Objective:    Wts  10/05/2023       160   02/06/2022     188  12/06/21 187 lb 3.2 oz (84.9  kg)  11/25/21 184 lb 4.9 oz (83.6 kg)  11/01/21 184 lb 6.4 oz (83.6 kg)    Vital signs reviewed  10/05/2023  - Note at rest 02 sats  97% on RA   General appearance:    amb pleasant wf chewing speermint gum  Lung clear        Assessment

## 2023-10-06 NOTE — Assessment & Plan Note (Addendum)
 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  Total time for H and P, chart review, counseling,   and generating customized AVS unique to this office visit / same day charting = 25 min

## 2023-10-16 DIAGNOSIS — M222X1 Patellofemoral disorders, right knee: Secondary | ICD-10-CM | POA: Diagnosis not present

## 2023-10-16 DIAGNOSIS — M170 Bilateral primary osteoarthritis of knee: Secondary | ICD-10-CM | POA: Diagnosis not present

## 2023-10-16 DIAGNOSIS — G8929 Other chronic pain: Secondary | ICD-10-CM | POA: Diagnosis not present

## 2023-10-16 DIAGNOSIS — M7121 Synovial cyst of popliteal space [Baker], right knee: Secondary | ICD-10-CM | POA: Diagnosis not present

## 2023-10-16 DIAGNOSIS — M222X2 Patellofemoral disorders, left knee: Secondary | ICD-10-CM | POA: Diagnosis not present

## 2023-11-08 DIAGNOSIS — Z1231 Encounter for screening mammogram for malignant neoplasm of breast: Secondary | ICD-10-CM | POA: Diagnosis not present

## 2023-11-12 ENCOUNTER — Encounter (HOSPITAL_COMMUNITY): Payer: Self-pay | Admitting: Nurse Practitioner

## 2023-11-21 DIAGNOSIS — Z86711 Personal history of pulmonary embolism: Secondary | ICD-10-CM | POA: Diagnosis not present

## 2023-11-21 DIAGNOSIS — R053 Chronic cough: Secondary | ICD-10-CM | POA: Diagnosis not present

## 2023-11-21 DIAGNOSIS — R232 Flushing: Secondary | ICD-10-CM | POA: Diagnosis not present

## 2023-11-21 DIAGNOSIS — Z299 Encounter for prophylactic measures, unspecified: Secondary | ICD-10-CM | POA: Diagnosis not present

## 2023-11-21 DIAGNOSIS — I10 Essential (primary) hypertension: Secondary | ICD-10-CM | POA: Diagnosis not present

## 2023-12-06 DIAGNOSIS — Z299 Encounter for prophylactic measures, unspecified: Secondary | ICD-10-CM | POA: Diagnosis not present

## 2023-12-06 DIAGNOSIS — I10 Essential (primary) hypertension: Secondary | ICD-10-CM | POA: Diagnosis not present

## 2023-12-06 DIAGNOSIS — D6859 Other primary thrombophilia: Secondary | ICD-10-CM | POA: Diagnosis not present

## 2023-12-06 DIAGNOSIS — M2550 Pain in unspecified joint: Secondary | ICD-10-CM | POA: Diagnosis not present

## 2024-01-14 DIAGNOSIS — M7121 Synovial cyst of popliteal space [Baker], right knee: Secondary | ICD-10-CM | POA: Diagnosis not present

## 2024-01-14 DIAGNOSIS — M222X2 Patellofemoral disorders, left knee: Secondary | ICD-10-CM | POA: Diagnosis not present

## 2024-01-14 DIAGNOSIS — M222X1 Patellofemoral disorders, right knee: Secondary | ICD-10-CM | POA: Diagnosis not present

## 2024-01-14 DIAGNOSIS — M170 Bilateral primary osteoarthritis of knee: Secondary | ICD-10-CM | POA: Diagnosis not present

## 2024-02-07 DIAGNOSIS — M25562 Pain in left knee: Secondary | ICD-10-CM | POA: Diagnosis not present

## 2024-02-07 DIAGNOSIS — G8929 Other chronic pain: Secondary | ICD-10-CM | POA: Diagnosis not present

## 2024-02-07 DIAGNOSIS — M25561 Pain in right knee: Secondary | ICD-10-CM | POA: Diagnosis not present

## 2024-02-07 DIAGNOSIS — M222X1 Patellofemoral disorders, right knee: Secondary | ICD-10-CM | POA: Diagnosis not present

## 2024-02-07 DIAGNOSIS — M222X2 Patellofemoral disorders, left knee: Secondary | ICD-10-CM | POA: Diagnosis not present

## 2024-02-07 DIAGNOSIS — M1712 Unilateral primary osteoarthritis, left knee: Secondary | ICD-10-CM | POA: Diagnosis not present

## 2024-02-07 DIAGNOSIS — M170 Bilateral primary osteoarthritis of knee: Secondary | ICD-10-CM | POA: Diagnosis not present

## 2024-02-11 DIAGNOSIS — Z23 Encounter for immunization: Secondary | ICD-10-CM | POA: Diagnosis not present

## 2024-02-14 DIAGNOSIS — M1712 Unilateral primary osteoarthritis, left knee: Secondary | ICD-10-CM | POA: Diagnosis not present

## 2024-02-20 DIAGNOSIS — R0981 Nasal congestion: Secondary | ICD-10-CM | POA: Diagnosis not present

## 2024-02-20 DIAGNOSIS — J320 Chronic maxillary sinusitis: Secondary | ICD-10-CM | POA: Diagnosis not present

## 2024-02-20 DIAGNOSIS — Z299 Encounter for prophylactic measures, unspecified: Secondary | ICD-10-CM | POA: Diagnosis not present

## 2024-02-20 DIAGNOSIS — J029 Acute pharyngitis, unspecified: Secondary | ICD-10-CM | POA: Diagnosis not present

## 2024-02-20 DIAGNOSIS — R531 Weakness: Secondary | ICD-10-CM | POA: Diagnosis not present

## 2024-02-21 DIAGNOSIS — M1712 Unilateral primary osteoarthritis, left knee: Secondary | ICD-10-CM | POA: Diagnosis not present

## 2024-04-07 DIAGNOSIS — E2839 Other primary ovarian failure: Secondary | ICD-10-CM | POA: Diagnosis not present
